# Patient Record
Sex: Female | Born: 1975 | Race: White | Hispanic: Yes | Marital: Married | Smoking: Never smoker
Health system: Southern US, Community
[De-identification: ages and names within clinical notes are randomized; demographics above are authoritative.]

## PROBLEM LIST (undated history)

## (undated) DIAGNOSIS — D219 Benign neoplasm of connective and other soft tissue, unspecified: Secondary | ICD-10-CM

## (undated) DIAGNOSIS — E785 Hyperlipidemia, unspecified: Secondary | ICD-10-CM

## (undated) DIAGNOSIS — Z5189 Encounter for other specified aftercare: Secondary | ICD-10-CM

## (undated) DIAGNOSIS — K219 Gastro-esophageal reflux disease without esophagitis: Secondary | ICD-10-CM

## (undated) HISTORY — PX: DILATION AND CURETTAGE OF UTERUS: SHX78

## (undated) HISTORY — DX: Benign neoplasm of connective and other soft tissue, unspecified: D21.9

## (undated) HISTORY — DX: Encounter for other specified aftercare: Z51.89

---

## 2001-09-26 ENCOUNTER — Ambulatory Visit (HOSPITAL_COMMUNITY): Admission: RE | Admit: 2001-09-26 | Discharge: 2001-09-26 | Payer: Self-pay | Admitting: *Deleted

## 2002-02-19 ENCOUNTER — Inpatient Hospital Stay (HOSPITAL_COMMUNITY): Admission: AD | Admit: 2002-02-19 | Discharge: 2002-02-20 | Payer: Self-pay | Admitting: *Deleted

## 2004-11-12 ENCOUNTER — Encounter: Admission: RE | Admit: 2004-11-12 | Discharge: 2004-11-12 | Payer: Self-pay | Admitting: Internal Medicine

## 2004-12-25 ENCOUNTER — Emergency Department (HOSPITAL_COMMUNITY): Admission: EM | Admit: 2004-12-25 | Discharge: 2004-12-26 | Payer: Self-pay | Admitting: Emergency Medicine

## 2005-02-07 ENCOUNTER — Encounter: Payer: Self-pay | Admitting: Emergency Medicine

## 2005-02-08 ENCOUNTER — Observation Stay (HOSPITAL_COMMUNITY): Admission: AD | Admit: 2005-02-08 | Discharge: 2005-02-08 | Payer: Self-pay | Admitting: Obstetrics and Gynecology

## 2005-05-14 ENCOUNTER — Inpatient Hospital Stay (HOSPITAL_COMMUNITY): Admission: AD | Admit: 2005-05-14 | Discharge: 2005-05-14 | Payer: Self-pay | Admitting: Obstetrics and Gynecology

## 2005-05-15 ENCOUNTER — Inpatient Hospital Stay (HOSPITAL_COMMUNITY): Admission: AD | Admit: 2005-05-15 | Discharge: 2005-05-15 | Payer: Self-pay | Admitting: Obstetrics and Gynecology

## 2005-06-02 ENCOUNTER — Ambulatory Visit (HOSPITAL_COMMUNITY): Admission: RE | Admit: 2005-06-02 | Discharge: 2005-06-02 | Payer: Self-pay | Admitting: *Deleted

## 2005-08-16 ENCOUNTER — Encounter (INDEPENDENT_AMBULATORY_CARE_PROVIDER_SITE_OTHER): Payer: Self-pay | Admitting: *Deleted

## 2005-08-16 ENCOUNTER — Inpatient Hospital Stay (HOSPITAL_COMMUNITY): Admission: AD | Admit: 2005-08-16 | Discharge: 2005-08-18 | Payer: Self-pay | Admitting: *Deleted

## 2009-05-09 ENCOUNTER — Inpatient Hospital Stay (HOSPITAL_COMMUNITY): Admission: AD | Admit: 2009-05-09 | Discharge: 2009-05-09 | Payer: Self-pay | Admitting: Family Medicine

## 2009-05-10 ENCOUNTER — Ambulatory Visit: Payer: Self-pay | Admitting: Obstetrics and Gynecology

## 2009-05-10 ENCOUNTER — Encounter: Payer: Self-pay | Admitting: Obstetrics & Gynecology

## 2009-05-10 ENCOUNTER — Observation Stay (HOSPITAL_COMMUNITY): Admission: AD | Admit: 2009-05-10 | Discharge: 2009-05-11 | Payer: Self-pay | Admitting: Obstetrics & Gynecology

## 2009-05-24 ENCOUNTER — Ambulatory Visit: Payer: Self-pay | Admitting: Obstetrics & Gynecology

## 2009-05-24 LAB — CONVERTED CEMR LAB
HCT: 36.4 % (ref 36.0–46.0)
Hemoglobin: 11.6 g/dL — ABNORMAL LOW (ref 12.0–15.0)
MCHC: 31.9 g/dL (ref 30.0–36.0)
MCV: 92.4 fL (ref 78.0–100.0)
Platelets: 391 10*3/uL (ref 150–400)
RBC: 3.94 M/uL (ref 3.87–5.11)
RDW: 14.8 % (ref 11.5–15.5)
WBC: 9.7 10*3/uL (ref 4.0–10.5)

## 2010-02-09 NOTE — L&D Delivery Note (Signed)
Delivery Note At 7:51 PM a healthy female was delivered via Vaginal, Spontaneous Delivery (Presentation: ; Occiput Anterior).  APGAR: 9, 9; weight 7 lb 10 oz (3459 g).   Placenta status: Intact, Spontaneous.  Cord: 3 vessels with the following complications: None.   Anesthesia: Local  Episiotomy: None Lacerations: 1st degree Suture Repair: 3.0 Est. Blood Loss (mL): 300 cc  Mom to postpartum.  Baby to nursery-stable.  Tamirah George,MARIE-LYNE 09/29/2010, 8:09 PM

## 2010-04-01 ENCOUNTER — Other Ambulatory Visit (HOSPITAL_COMMUNITY): Payer: Self-pay | Admitting: Obstetrics & Gynecology

## 2010-04-01 DIAGNOSIS — R772 Abnormality of alphafetoprotein: Secondary | ICD-10-CM

## 2010-04-01 DIAGNOSIS — Z0489 Encounter for examination and observation for other specified reasons: Secondary | ICD-10-CM

## 2010-04-29 LAB — POCT URINALYSIS DIP (DEVICE)
Bilirubin Urine: NEGATIVE
Glucose, UA: NEGATIVE mg/dL
Ketones, ur: NEGATIVE mg/dL
Nitrite: NEGATIVE
Protein, ur: NEGATIVE mg/dL
Specific Gravity, Urine: <=1.005 (ref 1.005–1.030)
Urobilinogen, UA: 0.2 mg/dL (ref 0.0–1.0)
pH: 5.5 (ref 5.0–8.0)

## 2010-04-30 LAB — CBC
HCT: 30.9 % — ABNORMAL LOW (ref 36.0–46.0)
HCT: 34.6 % — ABNORMAL LOW (ref 36.0–46.0)
Hemoglobin: 10.4 g/dL — ABNORMAL LOW (ref 12.0–15.0)
Hemoglobin: 11.9 g/dL — ABNORMAL LOW (ref 12.0–15.0)
MCHC: 33.5 g/dL (ref 30.0–36.0)
MCHC: 34.5 g/dL (ref 30.0–36.0)
MCV: 88.6 fL (ref 78.0–100.0)
MCV: 89.6 fL (ref 78.0–100.0)
Platelets: 187 10*3/uL (ref 150–400)
Platelets: 281 10*3/uL (ref 150–400)
RBC: 3.45 MIL/uL — ABNORMAL LOW (ref 3.87–5.11)
RBC: 3.91 MIL/uL (ref 3.87–5.11)
RDW: 13 % (ref 11.5–15.5)
RDW: 13.3 % (ref 11.5–15.5)
WBC: 11.3 10*3/uL — ABNORMAL HIGH (ref 4.0–10.5)
WBC: 17 10*3/uL — ABNORMAL HIGH (ref 4.0–10.5)

## 2010-04-30 LAB — CROSSMATCH
ABO/RH(D): O POS
Antibody Screen: NEGATIVE

## 2010-04-30 LAB — HEMOGLOBIN AND HEMATOCRIT, BLOOD
HCT: 29.1 % — ABNORMAL LOW (ref 36.0–46.0)
Hemoglobin: 10.2 g/dL — ABNORMAL LOW (ref 12.0–15.0)

## 2010-04-30 LAB — MRSA PCR SCREENING: MRSA by PCR: NEGATIVE

## 2010-05-04 LAB — GC/CHLAMYDIA PROBE AMP, GENITAL
Chlamydia, DNA Probe: NEGATIVE
GC Probe Amp, Genital: NEGATIVE

## 2010-05-04 LAB — WET PREP, GENITAL
Clue Cells Wet Prep HPF POC: NONE SEEN
Trich, Wet Prep: NONE SEEN
Yeast Wet Prep HPF POC: NONE SEEN

## 2010-05-04 LAB — ABO/RH: ABO/RH(D): O POS

## 2010-05-04 LAB — HEMOGLOBIN AND HEMATOCRIT, BLOOD
HCT: 41.6 % (ref 36.0–46.0)
Hemoglobin: 14.4 g/dL (ref 12.0–15.0)

## 2010-05-06 ENCOUNTER — Ambulatory Visit (HOSPITAL_COMMUNITY)
Admission: RE | Admit: 2010-05-06 | Discharge: 2010-05-06 | Disposition: A | Discharge status: Home / Self Care | Payer: Self-pay | Source: Ambulatory Visit | Attending: Obstetrics & Gynecology | Admitting: Obstetrics & Gynecology

## 2010-05-06 DIAGNOSIS — O358XX0 Maternal care for other (suspected) fetal abnormality and damage, not applicable or unspecified: Secondary | ICD-10-CM | POA: Insufficient documentation

## 2010-05-06 DIAGNOSIS — Z0489 Encounter for examination and observation for other specified reasons: Secondary | ICD-10-CM

## 2010-05-06 DIAGNOSIS — R772 Abnormality of alphafetoprotein: Secondary | ICD-10-CM

## 2010-06-27 NOTE — Consult Note (Signed)
Chelsea Yates, Chelsea Yates                  ACCOUNT NO.:  [id number]   MEDICAL RECORD NO.:  [id number]          PATIENT TYPE:  MAT   LOCATION:  [facility]                          FACILITY:  [facility]   PHYSICIAN:  Lenoard Aden, M.D.DATE OF BIRTH:  12/18/1975   DATE OF CONSULTATION:  DATE OF DISCHARGE:                                   CONSULTATION   CHIEF COMPLAINT:  Rule out fetal arrhythmia.   HISTORY OF PRESENT ILLNESS:  She is a 35 year old Hispanic female, G1, P0,  who is at [redacted] weeks gestation with a questionable new onset fetal arrhythmia.  She has had a normal ultrasound today and previous normal anatomical survey  and presents for a followup.   PHYSICAL EXAMINATION:  GENERAL:  She is a well-developed, well-nourished,  Hispanic female in no acute distress.  HEENT:  Normal.  LUNGS:  Clear.  HEART:  Regular rate and rhythm.  ABDOMEN:  Soft, gravid, nontender, fetal heart tones in the 140 to 150 beats  per minute range with irregular heartbeat noted, no evidence of tachy-A or  bradyarrhythmia.  PELVIC:  Exam deferred.  EXTREMITIES:  No cords.  NEUROLOGIC:  Nonfocal.   IMPRESSION:  New onset fetal arrhythmia.   PLAN:  Schedule fetal electrode __________ x1 to 2 hours stay and DC home  with followup scheduled.      Lenoard Aden, M.D.  Electronically Signed     RJT/MEDQ  D:  05/15/2005  T:  05/15/2005  Job:  [id number]

## 2010-09-12 LAB — STREP B DNA PROBE: GBS: NEGATIVE

## 2010-09-29 ENCOUNTER — Inpatient Hospital Stay (HOSPITAL_COMMUNITY)
Admission: AD | Admit: 2010-09-29 | Discharge: 2010-10-01 | DRG: 775 | Disposition: A | Discharge status: Home / Self Care | Payer: Medicaid Other | Source: Ambulatory Visit | Attending: Obstetrics & Gynecology | Admitting: Obstetrics & Gynecology

## 2010-09-29 ENCOUNTER — Encounter (HOSPITAL_COMMUNITY): Payer: Self-pay | Admitting: *Deleted

## 2010-09-29 DIAGNOSIS — O09519 Supervision of elderly primigravida, unspecified trimester: Secondary | ICD-10-CM | POA: Diagnosis present

## 2010-09-29 LAB — CBC
Hemoglobin: 13.5 g/dL (ref 12.0–15.0)
RBC: 4.46 MIL/uL (ref 3.87–5.11)
WBC: 11.4 10*3/uL — ABNORMAL HIGH (ref 4.0–10.5)

## 2010-09-29 MED ORDER — CITRIC ACID-SODIUM CITRATE 334-500 MG/5ML PO SOLN: 30.0000 mL | ORAL | Status: DC | PRN

## 2010-09-29 MED ORDER — IBUPROFEN 600 MG PO TABS
600.0000 mg | ORAL_TABLET | Freq: Four times a day (QID) | ORAL | Status: DC | PRN
  Administered 2010-09-29: 600 mg via ORAL
  Filled 2010-09-29: qty 1

## 2010-09-29 MED ORDER — WITCH HAZEL-GLYCERIN EX PADS: 1.0000 "application " | MEDICATED_PAD | CUTANEOUS | Status: DC | PRN

## 2010-09-29 MED ORDER — ZOLPIDEM TARTRATE 5 MG PO TABS: 5.0000 mg | ORAL_TABLET | Freq: Every evening | ORAL | Status: DC | PRN

## 2010-09-29 MED ORDER — PRENATAL PLUS 27-1 MG PO TABS
1.0000 | ORAL_TABLET | Freq: Every day | ORAL | Status: DC
  Administered 2010-09-30 – 2010-10-01 (×2): 1 via ORAL
  Filled 2010-09-29 (×2): qty 1

## 2010-09-29 MED ORDER — OXYCODONE-ACETAMINOPHEN 5-325 MG PO TABS
2.0000 | ORAL_TABLET | ORAL | Status: DC | PRN
  Administered 2010-09-29: 2 via ORAL
  Filled 2010-09-29: qty 2

## 2010-09-29 MED ORDER — ONDANSETRON HCL 4 MG PO TABS: 4.0000 mg | ORAL_TABLET | ORAL | Status: DC | PRN

## 2010-09-29 MED ORDER — OXYCODONE-ACETAMINOPHEN 5-325 MG PO TABS: 1.0000 | ORAL_TABLET | ORAL | Status: DC | PRN

## 2010-09-29 MED ORDER — OXYTOCIN 20 UNITS IN LACTATED RINGERS INFUSION - SIMPLE
125.0000 mL/h | INTRAVENOUS | Status: DC
  Administered 2010-09-29: 125 mL/h via INTRAVENOUS

## 2010-09-29 MED ORDER — SIMETHICONE 80 MG PO CHEW: 80.0000 mg | CHEWABLE_TABLET | ORAL | Status: DC | PRN

## 2010-09-29 MED ORDER — TETANUS-DIPHTH-ACELL PERTUSSIS 5-2.5-18.5 LF-MCG/0.5 IM SUSP
0.5000 mL | Freq: Once | INTRAMUSCULAR | Status: AC
  Administered 2010-09-30: 0.5 mL via INTRAMUSCULAR
  Filled 2010-09-29: qty 0.5

## 2010-09-29 MED ORDER — FLEET ENEMA 7-19 GM/118ML RE ENEM: 1.0000 | ENEMA | RECTAL | Status: DC | PRN

## 2010-09-29 MED ORDER — BENZOCAINE-MENTHOL 20-0.5 % EX AERO: 1.0000 "application " | INHALATION_SPRAY | CUTANEOUS | Status: DC | PRN

## 2010-09-29 MED ORDER — ONDANSETRON HCL 4 MG/2ML IJ SOLN: 4.0000 mg | INTRAMUSCULAR | Status: DC | PRN

## 2010-09-29 MED ORDER — LACTATED RINGERS IV SOLN: 500.0000 mL | INTRAVENOUS | Status: DC | PRN

## 2010-09-29 MED ORDER — ACETAMINOPHEN 325 MG PO TABS: 650.0000 mg | ORAL_TABLET | ORAL | Status: DC | PRN

## 2010-09-29 MED ORDER — SENNOSIDES-DOCUSATE SODIUM 8.6-50 MG PO TABS
2.0000 | ORAL_TABLET | Freq: Every day | ORAL | Status: DC
  Administered 2010-09-30: 2 via ORAL

## 2010-09-29 MED ORDER — LACTATED RINGERS IV SOLN
INTRAVENOUS | Status: DC
  Administered 2010-09-29: 19:00:00 via INTRAVENOUS

## 2010-09-29 MED ORDER — IBUPROFEN 600 MG PO TABS
600.0000 mg | ORAL_TABLET | Freq: Four times a day (QID) | ORAL | Status: DC
  Administered 2010-09-30 – 2010-10-01 (×6): 600 mg via ORAL
  Filled 2010-09-29 (×6): qty 1

## 2010-09-29 MED ORDER — OXYTOCIN BOLUS FROM INFUSION
500.0000 mL | Freq: Once | INTRAVENOUS | Status: DC
  Filled 2010-09-29: qty 1000
  Filled 2010-09-29: qty 500

## 2010-09-29 MED ORDER — LANOLIN HYDROUS EX OINT: TOPICAL_OINTMENT | CUTANEOUS | Status: DC | PRN

## 2010-09-29 MED ORDER — ONDANSETRON HCL 4 MG/2ML IJ SOLN: 4.0000 mg | Freq: Four times a day (QID) | INTRAMUSCULAR | Status: DC | PRN

## 2010-09-29 MED ORDER — LIDOCAINE HCL (PF) 1 % IJ SOLN
30.0000 mL | INTRAMUSCULAR | Status: DC | PRN
  Filled 2010-09-29 (×2): qty 30

## 2010-09-29 MED ORDER — DIPHENHYDRAMINE HCL 25 MG PO CAPS: 25.0000 mg | ORAL_CAPSULE | Freq: Four times a day (QID) | ORAL | Status: DC | PRN

## 2010-09-29 MED ORDER — DIBUCAINE 1 % RE OINT: 1.0000 "application " | TOPICAL_OINTMENT | RECTAL | Status: DC | PRN

## 2010-09-29 NOTE — H&P (Signed)
Chelsea Yates is a 35 y.o. female G1P0 [redacted]w[redacted]d presenting for spontaneous labor.  HPI/HPP:  Reg UCs increasing x 5 am today.  No AF leak.  No vag. Bleeding.  No PIH Sx                  Normal pregnancy, but Ultrascreen with increased risk, AMA 35 yo.  OB History    Grav Para Term Preterm Abortions TAB SAB Ect Mult Living   1              No past medical history on file. No past surgical history on file. Family History: family history is not on file. Social History:  does not have a smoking history on file. She does not have any smokeless tobacco history on file. Her alcohol and drug histories not on file. Current facility-administered medications:acetaminophen (TYLENOL) tablet 650 mg, 650 mg, Oral, Q4H PRN, Marie-Lyne Harrold Fitchett;  citric acid-sodium citrate (ORACIT) solution 30 mL, 30 mL, Oral, Q2H PRN, Marie-Lyne Ysidra Sopher;  ibuprofen (ADVIL,MOTRIN) tablet 600 mg, 600 mg, Oral, Q6H PRN, Marie-Lyne Shikita Vaillancourt;  lactated ringers infusion 500-1,000 mL, 500-1,000 mL, Intravenous, PRN, Marie-Lyne Kaylany Tesoriero lactated ringers infusion, , Intravenous, Continuous, Marie-Lyne Advait Buice, Last Rate: 125 mL/hr at 09/29/10 1911;  lidocaine (XYLOCAINE) 1 % injection 30 mL, 30 mL, Subcutaneous, PRN, Marie-Lyne Dorell Gatlin;  ondansetron (ZOFRAN) injection 4 mg, 4 mg, Intravenous, Q6H PRN, Marie-Lyne Samatha Anspach;  oxyCODONE-acetaminophen (PERCOCET) 5-325 MG per tablet 2 tablet, 2 tablet, Oral, Q3H PRN, Marie-Lyne Vitoria Conyer oxytocin (PITOCIN) IV BOLUS FROM BAG, 500 mL, Intravenous, Once, Marie-Lyne Jared Cahn;  oxytocin (PITOCIN) IV infusion 20 units in LR 1000 mL, 125 mL/hr, Intravenous, Continuous, Marie-Lyne Gardiner Espana, Last Rate: 125 mL/hr at 09/29/10 1954, 125 mL/hr at 09/29/10 1954;  sodium phosphate (FLEET) 7-19 GM/118ML enema 1 enema, 1 enema, Rectal, PRN, Marie-Lyne Derwood Becraft No Known Allergies  Dilation: 10 Effacement (%): 100 Station: 0 Exam by:: Dr.Janson Lamar Blood pressure 142/85, pulse 74, temperature 98.1 F (36.7 C), temperature source  Oral, resp. rate 20. AROM clear AF FHR 140's, good variability, no deceleration.  UCs q2-3 min.   HPP: There is no problem list on file for this patient.   Prenatal labs: ABO, Rh:  pos Antibody:  neg Rubella:  Immune RPR:   NR HBsAg:  NR  HIV:  NR  Genetic testing: Increased risk Down 1/23 on Ultrascreen, amnio declined [place] anato: wnl Level 2 1 hr GTT: wnl GBS: Negative (08/03 0000)   Assessment/Plan: Term G4P3 spontaneous labor, advanced dilatation.  F-well-being reassuring.  Ready for vaginal delivery.   Javaris Wigington,MARIE-LYNE 09/29/2010, 8:10 PM

## 2010-09-29 NOTE — Progress Notes (Signed)
Pt presents G5P3, very uncomfortable, SVE 7/100/-1 with a bulging membranes. Call to [facility] for admission orders .

## 2010-09-30 ENCOUNTER — Encounter (HOSPITAL_COMMUNITY): Payer: Self-pay | Admitting: Obstetrics and Gynecology

## 2010-09-30 LAB — CBC
HCT: 33.2 % — ABNORMAL LOW (ref 36.0–46.0)
Hemoglobin: 11.6 g/dL — ABNORMAL LOW (ref 12.0–15.0)
MCHC: 34.9 g/dL (ref 30.0–36.0)
MCV: 87.1 fL (ref 78.0–100.0)
RDW: 13.7 % (ref 11.5–15.5)
WBC: 12.6 10*3/uL — ABNORMAL HIGH (ref 4.0–10.5)

## 2010-09-30 LAB — RPR: RPR Ser Ql: NONREACTIVE

## 2010-09-30 NOTE — Progress Notes (Signed)
  PPD # 1  Subjective: Pt reports feeling well/ Pain controlled with motrin Tolerating po/ Voiding without problems/ No n/v Bleeding is moderate Newborn info:  Information for the patient's newborn:  Briyonna, Omara [[id number]]  female  / circ no/ Feeding: breast    Objective:  VS: Blood pressure 127/75, pulse 98, temperature 98.5 F (36.9 C), temperature source Oral, resp. rate 18   Basename 09/30/10 0538 09/29/10 1930  WBC 12.6* 11.4*  HGB 11.6* 13.5  HCT 33.2* 38.9  PLT 184 183    Blood type: --/--/O POS (08/20 1930) Rubella:   Immune   Physical Exam:  General: alert, cooperative and no distress CV: Regular rate and rhythm Resp: clear Abdomen: soft, nontender, normal bowel sounds Uterine Fundus: firm, below umbilicus, nontender Perineum: healing with good reapproximation Lochia: moderate Ext: Homans sign is negative, no sign of DVT and no edema, redness or tenderness in the calves or thighs   A/P: PPD # 1/ G1P1001 Spanish speaking; hospital interpreter, E Royal present during evaluation of patient. Considering BTL.  Will check with billing dept. Doing well Continue routine post partum orders Anticipate discharge home in AM

## 2010-09-30 NOTE — Progress Notes (Signed)
UR chart review completed.  

## 2010-10-01 MED ORDER — IBUPROFEN 600 MG PO TABS: 600.0000 mg | ORAL_TABLET | Freq: Four times a day (QID) | ORAL | Status: AC

## 2010-10-01 NOTE — Discharge Summary (Signed)
Reviewed, agree, contraception options reviewed, TBD again in office.

## 2010-10-01 NOTE — Discharge Summary (Signed)
Obstetric Discharge Summary Reason for Admission: onset of labor Prenatal Procedures: ultrasound Intrapartum Procedures: spontaneous vaginal delivery Postpartum Procedures: none Complications-Operative and Postpartum: 1st degree perineal laceration Hemoglobin  Date Value Range Status  09/30/2010 11.6* 12.0-15.0 (g/dL) Final     HCT  Date Value Range Status  09/30/2010 33.2* 36.0-46.0 (%) Final    Discharge Diagnoses: Term Pregnancy-delivered  Discharge Information: Date: 10/01/2010 Activity: pelvic rest Diet: routine Medications: Ibuprophen Condition: stable Instructions: refer to practice specific booklet and Spanish postpartum care instruction handout Discharge to: home Follow-up Information    Follow up with MODY,VAISHALI R in 6 weeks.   Contact information:   [address] [place] [address] [phone]          Newborn Data: Live born female  Birth Weight: 7 lb 10 oz (3459 g) APGAR: 9, 9  Home with mother.  PAUL,DANIELA 10/01/2010, 10:24 AM

## 2010-10-01 NOTE — Progress Notes (Addendum)
Post Partum Day #2           Information for the patient's newborn:  Jariyah, Hackley [[id number]]  female  / circumcision not desired   Subjective: No HA, SOB, CP, F/C, breast symptoms. Pain minimal. Normal vaginal bleeding, no clots.    Feeding: breast  Objective: BP 125/81  Pulse 80  Temp(Src) 98.2 F (36.8 C) (Oral)  Resp 18  Breastfeeding? Unknown  No intake or output data in the 24 hours ending 10/01/10 1021     Basename 09/30/10 0538 09/29/10 1930  WBC 12.6* 11.4*  HGB 11.6* 13.5  HCT 33.2* 38.9  PLT 184 183    Blood type: --/--/O POS (08/20 1930) Rubella:      Physical Exam:  General: alert, cooperative and no distress Uterine Fundus: firm Lochia: appropriate Perineum: repair intact, edema none DVT Evaluation: Negative Homan's sign. No significant calf/ankle edema.    Assessment/Plan: PPD # 2 / 35 y.o., G1P1001 S/P:spontaneous vaginal  Declined PPTL d/t cost    normal postpartum exam  Continue current postpartum care  D/C home  D/C instructions reviewed w/ patient and spouse via Spanish interpreter (Eda)  LOS: 2 days   Calirose Mccance, CNM, MSN 10/01/2010, 10:21 AM

## 2013-03-16 ENCOUNTER — Other Ambulatory Visit (HOSPITAL_COMMUNITY): Payer: Self-pay | Admitting: Family

## 2013-03-16 DIAGNOSIS — Z3689 Encounter for other specified antenatal screening: Secondary | ICD-10-CM

## 2013-04-13 ENCOUNTER — Ambulatory Visit (HOSPITAL_COMMUNITY)
Admission: RE | Admit: 2013-04-13 | Discharge: 2013-04-13 | Disposition: A | Discharge status: Home / Self Care | Payer: Medicaid Other | Source: Ambulatory Visit | Attending: Nurse Practitioner | Admitting: Nurse Practitioner

## 2013-04-13 ENCOUNTER — Other Ambulatory Visit (HOSPITAL_COMMUNITY): Payer: Self-pay | Admitting: Nurse Practitioner

## 2013-04-13 DIAGNOSIS — IMO0002 Reserved for concepts with insufficient information to code with codable children: Secondary | ICD-10-CM

## 2013-04-14 ENCOUNTER — Encounter (HOSPITAL_COMMUNITY)
Admission: AD | Disposition: A | Discharge status: Home / Self Care | Payer: Self-pay | Source: Ambulatory Visit | Attending: Obstetrics & Gynecology

## 2013-04-14 ENCOUNTER — Ambulatory Visit (HOSPITAL_COMMUNITY): Payer: Medicaid Other | Admitting: Anesthesiology

## 2013-04-14 ENCOUNTER — Ambulatory Visit (HOSPITAL_COMMUNITY)
Admission: AD | Admit: 2013-04-14 | Discharge: 2013-04-14 | Disposition: A | Discharge status: Home / Self Care | Payer: Medicaid Other | Source: Ambulatory Visit | Attending: Obstetrics & Gynecology | Admitting: Obstetrics & Gynecology

## 2013-04-14 ENCOUNTER — Encounter (HOSPITAL_COMMUNITY): Payer: Medicaid Other | Admitting: Anesthesiology

## 2013-04-14 ENCOUNTER — Encounter (HOSPITAL_COMMUNITY): Payer: Self-pay

## 2013-04-14 DIAGNOSIS — O019 Hydatidiform mole, unspecified: Secondary | ICD-10-CM

## 2013-04-14 DIAGNOSIS — O0289 Other abnormal products of conception: Secondary | ICD-10-CM

## 2013-04-14 DIAGNOSIS — O021 Missed abortion: Secondary | ICD-10-CM | POA: Insufficient documentation

## 2013-04-14 HISTORY — DX: Gastro-esophageal reflux disease without esophagitis: K21.9

## 2013-04-14 HISTORY — PX: DILATION AND EVACUATION: SHX1459

## 2013-04-14 LAB — CBC
HCT: 37.2 % (ref 36.0–46.0)
Hemoglobin: 12.9 g/dL (ref 12.0–15.0)
MCH: 29.5 pg (ref 26.0–34.0)
MCHC: 34.7 g/dL (ref 30.0–36.0)
MCV: 84.9 fL (ref 78.0–100.0)
PLATELETS: 304 10*3/uL (ref 150–400)
RBC: 4.38 MIL/uL (ref 3.87–5.11)
RDW: 13.2 % (ref 11.5–15.5)
WBC: 7.6 10*3/uL (ref 4.0–10.5)

## 2013-04-14 LAB — HCG, QUANTITATIVE, PREGNANCY: hCG, Beta Chain, Quant, S: 11915 m[IU]/mL — ABNORMAL HIGH (ref <5)

## 2013-04-14 SURGERY — DILATION AND EVACUATION, UTERUS
Anesthesia: Monitor Anesthesia Care | Site: Vagina

## 2013-04-14 MED ORDER — LACTATED RINGERS IV SOLN
INTRAVENOUS | Status: DC
Start: 2013-04-14 — End: 2013-04-14
  Administered 2013-04-14: 14:00:00 via INTRAVENOUS

## 2013-04-14 MED ORDER — PROPOFOL 10 MG/ML IV EMUL
INTRAVENOUS | Status: AC
  Filled 2013-04-14: qty 20

## 2013-04-14 MED ORDER — FENTANYL CITRATE 0.05 MG/ML IJ SOLN
INTRAMUSCULAR | Status: AC
Start: 2013-04-14 — End: 2013-04-14
  Filled 2013-04-14: qty 2

## 2013-04-14 MED ORDER — MIDAZOLAM HCL 2 MG/2ML IJ SOLN
INTRAMUSCULAR | Status: AC
  Filled 2013-04-14: qty 2

## 2013-04-14 MED ORDER — LIDOCAINE HCL (CARDIAC) 20 MG/ML IV SOLN
INTRAVENOUS | Status: DC | PRN
  Administered 2013-04-14: 80 mg via INTRAVENOUS

## 2013-04-14 MED ORDER — KETOROLAC TROMETHAMINE 30 MG/ML IJ SOLN
INTRAMUSCULAR | Status: AC
  Filled 2013-04-14: qty 1

## 2013-04-14 MED ORDER — KETOROLAC TROMETHAMINE 30 MG/ML IJ SOLN
INTRAMUSCULAR | Status: DC | PRN
  Administered 2013-04-14: 30 mg via INTRAVENOUS

## 2013-04-14 MED ORDER — DEXAMETHASONE SODIUM PHOSPHATE 10 MG/ML IJ SOLN
INTRAMUSCULAR | Status: DC | PRN
  Administered 2013-04-14: 10 mg via INTRAVENOUS

## 2013-04-14 MED ORDER — BUPIVACAINE HCL (PF) 0.5 % IJ SOLN
INTRAMUSCULAR | Status: AC
  Filled 2013-04-14: qty 30

## 2013-04-14 MED ORDER — ONDANSETRON HCL 4 MG/2ML IJ SOLN
INTRAMUSCULAR | Status: DC | PRN
  Administered 2013-04-14: 4 mg via INTRAVENOUS

## 2013-04-14 MED ORDER — PROPOFOL 10 MG/ML IV EMUL
INTRAVENOUS | Status: DC | PRN
  Administered 2013-04-14 (×2): 30 mg via INTRAVENOUS
  Administered 2013-04-14: 20 mg via INTRAVENOUS
  Administered 2013-04-14: 30 mg via INTRAVENOUS
  Administered 2013-04-14: 20 mg via INTRAVENOUS
  Administered 2013-04-14: 30 mg via INTRAVENOUS
  Administered 2013-04-14 (×2): 20 mg via INTRAVENOUS
  Administered 2013-04-14 (×2): 30 mg via INTRAVENOUS

## 2013-04-14 MED ORDER — FENTANYL CITRATE 0.05 MG/ML IJ SOLN: 25.0000 ug | INTRAMUSCULAR | Status: DC | PRN

## 2013-04-14 MED ORDER — MIDAZOLAM HCL 2 MG/2ML IJ SOLN
INTRAMUSCULAR | Status: DC | PRN
  Administered 2013-04-14: 2 mg via INTRAVENOUS

## 2013-04-14 MED ORDER — IBUPROFEN 600 MG PO TABS: 600.0000 mg | ORAL_TABLET | Freq: Four times a day (QID) | ORAL | Status: DC | PRN

## 2013-04-14 MED ORDER — DEXTROSE 5 % IV SOLN
100.0000 mg | Freq: Once | INTRAVENOUS | Status: AC
  Administered 2013-04-14: 100 mg via INTRAVENOUS
  Filled 2013-04-14: qty 100

## 2013-04-14 MED ORDER — LIDOCAINE HCL (CARDIAC) 20 MG/ML IV SOLN
INTRAVENOUS | Status: AC
  Filled 2013-04-14: qty 5

## 2013-04-14 MED ORDER — OXYCODONE-ACETAMINOPHEN 5-325 MG PO TABS: 1.0000 | ORAL_TABLET | Freq: Four times a day (QID) | ORAL | Status: DC | PRN

## 2013-04-14 MED ORDER — BUPIVACAINE HCL (PF) 0.5 % IJ SOLN
INTRAMUSCULAR | Status: DC | PRN
  Administered 2013-04-14: 30 mL

## 2013-04-14 MED ORDER — METOCLOPRAMIDE HCL 5 MG/ML IJ SOLN: 10.0000 mg | Freq: Once | INTRAMUSCULAR | Status: DC | PRN

## 2013-04-14 MED ORDER — ONDANSETRON HCL 4 MG/2ML IJ SOLN
INTRAMUSCULAR | Status: AC
  Filled 2013-04-14: qty 2

## 2013-04-14 MED ORDER — FENTANYL CITRATE 0.05 MG/ML IJ SOLN
INTRAMUSCULAR | Status: DC | PRN
  Administered 2013-04-14 (×2): 50 ug via INTRAVENOUS

## 2013-04-14 MED ORDER — MEPERIDINE HCL 25 MG/ML IJ SOLN: 6.2500 mg | INTRAMUSCULAR | Status: DC | PRN

## 2013-04-14 MED ORDER — PANTOPRAZOLE SODIUM 40 MG PO TBEC
DELAYED_RELEASE_TABLET | ORAL | Status: AC
  Administered 2013-04-14: 40 mg via ORAL
  Filled 2013-04-14: qty 1

## 2013-04-14 MED ORDER — PANTOPRAZOLE SODIUM 40 MG PO TBEC
40.0000 mg | DELAYED_RELEASE_TABLET | Freq: Once | ORAL | Status: AC
  Administered 2013-04-14: 40 mg via ORAL

## 2013-04-14 MED ORDER — KETOROLAC TROMETHAMINE 30 MG/ML IJ SOLN: 15.0000 mg | Freq: Once | INTRAMUSCULAR | Status: DC | PRN

## 2013-04-14 SURGICAL SUPPLY — 19 items
CATH ROBINSON RED A/P 16FR (CATHETERS) IMPLANT
CLOTH BEACON ORANGE TIMEOUT ST (SAFETY) ×3 IMPLANT
DECANTER SPIKE VIAL GLASS SM (MISCELLANEOUS) ×3 IMPLANT
GLOVE BIO SURGEON STRL SZ 6.5 (GLOVE) ×2 IMPLANT
GLOVE BIO SURGEONS STRL SZ 6.5 (GLOVE) ×1
GOWN STRL REUS W/TWL LRG LVL3 (GOWN DISPOSABLE) ×6 IMPLANT
KIT BERKELEY 1ST TRIMESTER 3/8 (MISCELLANEOUS) ×3 IMPLANT
NEEDLE SPNL 18GX3.5 QUINCKE PK (NEEDLE) ×3 IMPLANT
NS IRRIG 1000ML POUR BTL (IV SOLUTION) ×3 IMPLANT
PACK VAGINAL MINOR WOMEN LF (CUSTOM PROCEDURE TRAY) ×3 IMPLANT
PAD OB MATERNITY 4.3X12.25 (PERSONAL CARE ITEMS) ×3 IMPLANT
PAD PREP 24X48 CUFFED NSTRL (MISCELLANEOUS) ×3 IMPLANT
SET BERKELEY SUCTION TUBING (SUCTIONS) ×3 IMPLANT
SYR CONTROL 10ML LL (SYRINGE) ×3 IMPLANT
TOWEL OR 17X24 6PK STRL BLUE (TOWEL DISPOSABLE) ×6 IMPLANT
VACURETTE 10 RIGID CVD (CANNULA) IMPLANT
VACURETTE 7MM CVD STRL WRAP (CANNULA) IMPLANT
VACURETTE 8 RIGID CVD (CANNULA) ×2 IMPLANT
VACURETTE 9 RIGID CVD (CANNULA) ×3 IMPLANT

## 2013-04-14 NOTE — Anesthesia Preprocedure Evaluation (Addendum)
Anesthesia Evaluation  Patient identified by MRN, date of birth, ID band Patient awake    Reviewed: Allergy & Precautions, H&P , NPO status , Patient's Chart, lab work & pertinent test results, reviewed documented beta blocker date and time   History of Anesthesia Complications Negative for: history of anesthetic complications  Airway Mallampati: III TM Distance: >3 FB Neck ROM: full    Dental  (+) Edentulous Upper, Edentulous Lower, Upper Dentures, Lower Dentures   Pulmonary neg pulmonary ROS,  breath sounds clear to auscultation        Cardiovascular negative cardio ROS  Rhythm:regular Rate:Normal     Neuro/Psych negative neurological ROS  negative psych ROS   GI/Hepatic negative GI ROS, Neg liver ROS,   Endo/Other  negative endocrine ROS  Renal/GU negative Renal ROS     Musculoskeletal   Abdominal   Peds  Hematology negative hematology ROS (+)   Anesthesia Other Findings   Reproductive/Obstetrics (+) Pregnancy (partial molar pregnancy, 8 weeks)                          Anesthesia Physical Anesthesia Plan  ASA: II  Anesthesia Plan: MAC   Post-op Pain Management:    Induction:   Airway Management Planned:   Additional Equipment:   Intra-op Plan:   Post-operative Plan:   Informed Consent: I have reviewed the patients History and Physical, chart, labs and discussed the procedure including the risks, benefits and alternatives for the proposed anesthesia with the patient or authorized representative who has indicated his/her understanding and acceptance.     Plan Discussed with: Surgeon and CRNA  Anesthesia Plan Comments:         Anesthesia Quick Evaluation

## 2013-04-14 NOTE — Discharge Instructions (Signed)
Dilatacin y curetaje o curetaje por aspiracin - Cuidados posteriores (Dilation and Curettage or Vacuum Curettage, Care After) Siga estas instrucciones durante las prximas semanas. Estas indicaciones le proporcionan informacin general acerca de cmo deber cuidarse despus del procedimiento. El mdico tambin podr darle instrucciones ms especficas. El tratamiento se ha planificado de acuerdo a las prcticas mdicas actuales, pero a veces se producen problemas. Comunquese con el mdico si tiene algn problema o tiene dudas despus del procedimiento. QU ESPERAR DESPUS DEL PROCEDIMIENTO Despus del procedimiento, es tpico tener clicos. Pueden durar entre 1 y 2 das despus del procedimiento. INSTRUCCIONES PARA EL CUIDADO EN EL HOGAR   No conduzca vehculos por 24 horas.  Espere una semana antes de [employer] actividades intensas.  [place], durante 4 das y Chartered certified accountant. Si tiene fiebre, informe estas temperaturas a su mdico.  Government social research officer de pie durante largos perodos.  Evite levantar, tironear o empujar objetos pesados. No levante nada que pese ms de 10 libras (4,5 kg).  Slo suba escaleras una o dos veces por da.  Tome con frecuencia momentos de descanso.  Puede retomar su dieta habitual.  Beba suficiente lquido para mantener la orina clara o de color amarillo plido.  La funcin intestinal normal se restablecer. Si est constipada podr:   Tomar un laxante suave con autorizacin de su mdico.  Agregar frutas y salvado a su dieta.  Beber ms lquidos.  Tome Dentist de baos de inversin hasta que el mdico la autorice.  No tome baos de inmersin ni practique natacin hasta que el mdico la autorice.  Pdale a alguna persona que permanezca con usted durante las primeras 24 a 48 horas, especialmente si le han administrado anestesia general.  No se haga duchas vaginales ni tenga relaciones sexuales durante las 6 semanas  siguientes a la [place].  Tome slo medicamentos de venta libre o recetados, segn las indicaciones del mdico. No tome aspirina. Puede ocasionar hemorragias.  Concurra a las consultas de control con su mdico segn las indicaciones. SOLICITE ATENCIN MDICA SI:   Siente clicos o el dolor no se Radio broadcast assistant.  Siente dolor abdominal que no parece estar relacionado con el rea en que senta los clicos y Conservation officer, historic buildings anteriormente.  Margette Fast secrecin vaginal con mal olor.  Tiene una erupcin.  Tiene problemas con algn medicamento. SOLICITE ATENCIN MDICA DE INMEDIATO SI:   Tiene una hemorragia ms abundante que un perodo menstrual normal.  Tiene fiebre.  Siente dolor en el pecho.  Le falta el aire.  Se siente mareada o sufre un desmayo.  Se desmaya.  Siente dolor en la zona de los breteles de los hombros.  Tiene una hemorragia vaginal abundante, con o sin cogulos Document Released: 11/05/2004 Document Revised: 11/16/2012 [facility] Patient Information 2014 [place], [place].

## 2013-04-14 NOTE — Transfer of Care (Signed)
Immediate Anesthesia Transfer of Care Note  Patient: Chelsea Yates  Procedure(s) Performed: Procedure(s): DILATATION AND CURETTAGE (N/A)  Patient Location: PACU  Anesthesia Type:MAC  Level of Consciousness: sedated  Airway & Oxygen Therapy: Patient Spontanous Breathing  Post-op Assessment: Report given to PACU RN  Post vital signs: Reviewed and stable  Complications: No apparent anesthesia complications

## 2013-04-14 NOTE — Transfer of Care (Deleted)
Immediate Anesthesia Transfer of Care Note  Patient: Chelsea Yates  Procedure(s) Performed: Procedure(s): DILATATION AND EVACUATION (N/A)  Patient Location: PACU  Anesthesia Type:Spinal  Level of Consciousness: awake, alert , oriented and patient cooperative  Airway & Oxygen Therapy: Patient Spontanous Breathing  Post-op Assessment: Report given to PACU RN  Post vital signs: Reviewed and stable  Complications: No apparent anesthesia complications

## 2013-04-14 NOTE — Preoperative (Signed)
Beta Blockers   Reason not to administer Beta Blockers:Not Applicable 

## 2013-04-14 NOTE — Op Note (Signed)
04/14/2013  2:55 PM  PATIENT:  Chelsea Yates  38 y.o. female  PRE-OPERATIVE DIAGNOSIS:  Non-viable pregnancy  POST-OPERATIVE DIAGNOSIS:  same  PROCEDURE:  Procedure(s): DILATATION AND CURETTAGE (N/A)  SURGEON:  Surgeon(s) and Role:    * Emily Filbert, MD - Primary  PHYSICIAN ASSISTANT:   ASSISTANTS: Glyn Ade, MD   ANESTHESIA:   local and MAC  EBL:     BLOOD ADMINISTERED:none  DRAINS: none   LOCAL MEDICATIONS USED:  MARCAINE     SPECIMEN:  Source of Specimen:  uterine curettings  DISPOSITION OF SPECIMEN:  PATHOLOGY  COUNTS:  YES  TOURNIQUET:  * No tourniquets in log *  DICTATION: .Dragon Dictation  PLAN OF CARE: Discharge to home after PACU  PATIENT DISPOSITION:  PACU - hemodynamically stable.   Delay start of Pharmacological VTE agent (>24hrs) due to surgical blood loss or risk of bleeding: not applicable   The risks, benefits, and alternatives of surgery were explained, understood, and accepted. All questions were answered. Consents were signed. In the operating room MAC anesthesia was applied without complication, and she was placed in the dorsal lithotomy position. Her vagina was prepped and draped in the usual sterile fashion. A Robinson catheter was used to drain her bladder. A bimanual exam revealed a 6 week size , anteverted mobile uterus. Her adnexa were nonenlarged. A speculum was placed and a single-tooth tenaculum was used to grasp the anterior lip of her cervix. A total of 30 mL of 0.5% Marcaine was used to perform a paracervical block. Her uterus sounded to 9 cm. Her cervix was carefully and slowly and easily dilated to accommodate a #9 curved suction curet. A curettage was done in all quadrants and the fundus of the uterus. A large amount of tissue was obtained. I then used a sharp curette to assure complete emptying of the uterus. There was no bleeding noted at the end of the case. She was taken to the recovery room after being extubated. She tolerated the  procedure well.

## 2013-04-14 NOTE — Anesthesia Postprocedure Evaluation (Signed)
  Anesthesia Post-op Note  Anesthesia Post Note  Patient: Chelsea Yates  Procedure(s) Performed: Procedure(s) (LRB): DILATATION AND CURETTAGE (N/A)  Anesthesia type: MAC  Patient location: PACU  Post pain: Pain level controlled  Post assessment: Post-op Vital signs reviewed  Last Vitals:  Filed Vitals:   04/14/13 1615  BP: 99/54  Pulse: 79  Temp:   Resp: 16    Post vital signs: Reviewed  Level of consciousness: sedated  Complications: No apparent anesthesia complications

## 2013-04-14 NOTE — H&P (Signed)
  38 yo MH G6P4A1 now with non viable pregnancy. This was discovered on u/s yesterday.  PMH- none  PSH- d&c for miscarriage  Meds-PNV  NKDA  SH- negative  FH- [place]  VSS, AF WNWHWF NAD  Heart- rrr Lungs- CTAB Abd- benign  A/p. Non-viable pregnancy. She opts for d&c. Risks of surgery explained.

## 2013-04-17 ENCOUNTER — Encounter (HOSPITAL_COMMUNITY): Payer: Self-pay | Admitting: Obstetrics & Gynecology

## 2013-04-26 DIAGNOSIS — O020 Blighted ovum and nonhydatidiform mole: Secondary | ICD-10-CM

## 2013-04-27 ENCOUNTER — Ambulatory Visit (HOSPITAL_COMMUNITY): Payer: Medicaid Other

## 2013-05-24 ENCOUNTER — Ambulatory Visit (INDEPENDENT_AMBULATORY_CARE_PROVIDER_SITE_OTHER): Payer: Medicaid Other | Admitting: Obstetrics & Gynecology

## 2013-05-24 ENCOUNTER — Encounter: Payer: Self-pay | Admitting: Obstetrics & Gynecology

## 2013-05-24 VITALS — BP 130/82 | HR 80 | Temp 97.6°F | Ht <= 58 in | Wt 148.8 lb

## 2013-05-24 DIAGNOSIS — O0289 Other abnormal products of conception: Secondary | ICD-10-CM

## 2013-05-24 DIAGNOSIS — Z09 Encounter for follow-up examination after completed treatment for conditions other than malignant neoplasm: Secondary | ICD-10-CM

## 2013-05-24 DIAGNOSIS — O020 Blighted ovum and nonhydatidiform mole: Secondary | ICD-10-CM

## 2013-05-24 LAB — HCG, QUANTITATIVE, PREGNANCY

## 2013-05-24 MED ORDER — IBUPROFEN 800 MG PO TABS: 800.0000 mg | ORAL_TABLET | Freq: Three times a day (TID) | ORAL | Status: DC | PRN

## 2013-05-24 NOTE — Progress Notes (Signed)
   Subjective:    Patient ID: Chelsea Yates, female    DOB: 1975/06/01, 38 y.o.   MRN: [id number]  HPI  38 yo MH lady who had a d&c for a non-viable pregnancy 6 weeks ago. A molar pregnancy was discovered. Her QBHCG was 11000 on 04-14-13 and will be redrawn today. She has not had sex since the d&c. She had a normal period 05-19-13. She does report some LBP but it has not been bad enough for her to take Tylenol or IBU.  Review of Systems     Objective:   Physical Exam  Cervix appears normal Uterus ULN size, AV, mobile, NT Normal adnexal exam      Assessment & Plan:  Molar pregnancy- follow QBHCG LBP- IBU 800 mg q 8 hr prn U/S and CXR if QBHCG not falling

## 2013-06-07 ENCOUNTER — Other Ambulatory Visit: Payer: Self-pay

## 2013-06-07 DIAGNOSIS — E349 Endocrine disorder, unspecified: Secondary | ICD-10-CM

## 2013-06-08 LAB — HCG, QUANTITATIVE, PREGNANCY

## 2013-06-21 ENCOUNTER — Other Ambulatory Visit: Payer: Self-pay

## 2013-06-21 DIAGNOSIS — O020 Blighted ovum and nonhydatidiform mole: Secondary | ICD-10-CM

## 2013-06-22 LAB — HCG, QUANTITATIVE, PREGNANCY: hCG, Beta Chain, Quant, S: 3.6 m[IU]/mL

## 2013-07-19 ENCOUNTER — Other Ambulatory Visit: Payer: Self-pay

## 2013-07-19 ENCOUNTER — Encounter: Payer: Self-pay | Admitting: General Practice

## 2013-07-19 DIAGNOSIS — O020 Blighted ovum and nonhydatidiform mole: Secondary | ICD-10-CM

## 2013-07-19 NOTE — Progress Notes (Signed)
Patient here in office today for bhcg only and wants to know when to follow up again. Raquel for interpreter. Dr Elly Modena reviewed chart and stated to make sure patient is using condoms each time and that based off what that hcg is determines when she will need to follow up. Patient informed to use condoms each time and that we will call her and let her know when she needs to follow up. Patient verbalized understanding and had no questions

## 2013-07-20 LAB — HCG, QUANTITATIVE, PREGNANCY: hCG, Beta Chain, Quant, S: <2 m[IU]/mL

## 2013-07-21 ENCOUNTER — Telehealth: Payer: Self-pay | Admitting: *Deleted

## 2013-07-21 NOTE — Telephone Encounter (Signed)
Message copied by Mitchell Heir on Fri Jul 21, 2013 11:55 AM ------      Message from: CONSTANT, [place]      Created: Thu Jul 20, 2013 10:44 AM       Please inform patient of need to continue monthly quant HCG until December 2015. Please stress the importance of using contraception during this time as to avoid a new pregnancy. ------

## 2013-07-25 NOTE — Telephone Encounter (Signed)
Attempted to call patient with [facility] interpreter [phone]. Informed patient of results and the need to have a beta quant drawn once every month until December 2015. Explained this is to ensure hormone remains <2. Discussed the importance of not becoming pregnant during this time frame as the likelihood of molar pregnancy again would be high and dangerous. Advised patient that she should absolutley use condoms or other method of contraception during this time so that she does not become pregnant. Patient verbalized understanding. Patient states she can come for repeat beta quant on August 18, 2013 at 1000. Informed patient I would put her on the schedule. No further questions or concerns.

## 2013-08-17 ENCOUNTER — Other Ambulatory Visit: Payer: Self-pay

## 2013-08-17 DIAGNOSIS — O020 Blighted ovum and nonhydatidiform mole: Secondary | ICD-10-CM

## 2013-08-18 ENCOUNTER — Other Ambulatory Visit: Payer: Self-pay

## 2013-08-18 LAB — HCG, QUANTITATIVE, PREGNANCY

## 2013-09-07 ENCOUNTER — Telehealth: Payer: Self-pay | Admitting: *Deleted

## 2013-09-07 NOTE — Telephone Encounter (Signed)
Patient called to request to cancel her appointment. I spoke with her via interpreter Dori. I informed patient that it is very important for her to come for her labs monthly until December as she had a molar pregnancy and it is critical that we monitor her labs closely. Patient agrees and stated that she will come for her appointment.

## 2013-09-18 ENCOUNTER — Other Ambulatory Visit: Payer: Self-pay

## 2013-09-18 ENCOUNTER — Telehealth: Payer: Self-pay | Admitting: *Deleted

## 2013-09-18 DIAGNOSIS — N939 Abnormal uterine and vaginal bleeding, unspecified: Secondary | ICD-10-CM

## 2013-09-18 LAB — HCG, QUANTITATIVE, PREGNANCY: hCG, Beta Chain, Quant, S: <2 m[IU]/mL

## 2013-09-18 NOTE — Telephone Encounter (Signed)
Contacted patient with [employer], results and plan given.  Pt verbalizes understanding.  Message sent to front desk to place on lab schedule on 10/19/2013 at 1:00pm.

## 2013-09-18 NOTE — Telephone Encounter (Signed)
Message copied by Sue Lush on Mon Sep 18, 2013  4:40 PM ------      Message from: Verita Schneiders A      Created: Mon Sep 18, 2013  4:38 PM       Patient needs to return for repeat HCG check next month ~ 10/19/13.  If still negative, no further checks needed (negative for 6 months) ------

## 2013-10-19 ENCOUNTER — Other Ambulatory Visit: Payer: Self-pay

## 2013-10-19 DIAGNOSIS — O020 Blighted ovum and nonhydatidiform mole: Secondary | ICD-10-CM

## 2013-10-20 LAB — HCG, QUANTITATIVE, PREGNANCY: hCG, Beta Chain, Quant, S: <2 m[IU]/mL

## 2013-10-25 ENCOUNTER — Telehealth: Payer: Self-pay | Admitting: *Deleted

## 2013-10-25 NOTE — Telephone Encounter (Signed)
Pt left message w/interpreter requesting results.  She will needs Spanish interpreter when calling back.

## 2013-10-26 NOTE — Telephone Encounter (Signed)
Called patient back with [facility] for interpreter and patient was requesting bhcg results. Per chart review patient was having bhcg levels followed for molar pregnancy and doesn't need further follow up of hcg levels since she is now negative for 6 months. Informed patient of results and that no further follow up is needed at this time. Patient verbalized understanding and is aware that she needs to continue to prevent pregnancy and should not try to get pregnant until next year if she desires. Patient verbalized understanding and had no other questions

## 2013-11-17 ENCOUNTER — Other Ambulatory Visit: Payer: Self-pay

## 2013-12-11 ENCOUNTER — Encounter: Payer: Self-pay | Admitting: Obstetrics & Gynecology

## 2013-12-19 ENCOUNTER — Other Ambulatory Visit: Payer: Self-pay

## 2014-01-18 ENCOUNTER — Other Ambulatory Visit: Payer: Self-pay

## 2016-11-16 ENCOUNTER — Other Ambulatory Visit: Payer: Self-pay | Admitting: Obstetrics and Gynecology

## 2016-11-16 DIAGNOSIS — Z1231 Encounter for screening mammogram for malignant neoplasm of breast: Secondary | ICD-10-CM

## 2016-11-19 ENCOUNTER — Ambulatory Visit
Admission: RE | Admit: 2016-11-19 | Discharge: 2016-11-19 | Disposition: A | Discharge status: Home / Self Care | Payer: No Typology Code available for payment source | Source: Ambulatory Visit | Attending: Obstetrics and Gynecology | Admitting: Obstetrics and Gynecology

## 2016-11-19 ENCOUNTER — Encounter (HOSPITAL_COMMUNITY): Payer: Self-pay

## 2016-11-19 ENCOUNTER — Ambulatory Visit (HOSPITAL_COMMUNITY)
Admission: RE | Admit: 2016-11-19 | Discharge: 2016-11-19 | Disposition: A | Discharge status: Home / Self Care | Payer: Self-pay | Source: Ambulatory Visit | Attending: Obstetrics and Gynecology | Admitting: Obstetrics and Gynecology

## 2016-11-19 VITALS — BP 124/84 | Temp 98.8°F | Ht <= 58 in

## 2016-11-19 DIAGNOSIS — Z01419 Encounter for gynecological examination (general) (routine) without abnormal findings: Secondary | ICD-10-CM

## 2016-11-19 DIAGNOSIS — Z1231 Encounter for screening mammogram for malignant neoplasm of breast: Secondary | ICD-10-CM

## 2016-11-19 NOTE — Patient Instructions (Signed)
Explained breast self awareness with Hulda Humphrey. Let patient know [facility] will cover Pap smears and HPV typing every 5 years unless has a history of abnormal Pap smears. Referred patient to the [facility] for a screening mammogram. Appointment scheduled for Thursday, November 19, 2016 at 1040. Let patient know will follow up with her within the next couple weeks with results of Pap smear by phone. Informed patient that [facility] will follow up with her within the next couple of weeks with results of mammogram by letter or phone. Chelsea Yates verbalized understanding.  Chelsea Yates, Arvil Chaco, RN 12:47 PM

## 2016-11-19 NOTE — Progress Notes (Signed)
No complaints today.   Pap Smear: Pap smear completed today. Last Pap smear was 06/21/2008 and normal. Per patient has no history of an abnormal Pap smear. Last Pap smear result is in EPIC.  Physical exam: Breasts Breasts symmetrical. No skin abnormalities bilateral breasts. No nipple retraction bilateral breasts. No nipple discharge bilateral breasts. No lymphadenopathy. No lumps palpated bilateral breasts. No complaints of pain or tenderness on exam. Referred patient to the [facility] for a screening mammogram. Appointment scheduled for Thursday, November 19, 2016 at 1040.  Pelvic/Bimanual   Ext Genitalia No lesions, no swelling and no discharge observed on external genitalia.         Vagina Vagina pink and normal texture. No lesions or discharge observed in vagina.          Cervix Cervix is present. Cervix pink and of normal texture. No discharge observed.     Uterus Uterus is present and palpable. Uterus in normal position and normal size.        Adnexae Bilateral ovaries present and palpable. No tenderness on palpation.          Rectovaginal No rectal exam completed today since patient had no rectal complaints. No skin abnormalities observed on exam.    Smoking History: Patient has never smoked.  Patient Navigation: Patient education provided. Access to services provided for patient through [facility] program. Spanish interpreter provided.  Used Spanish interpreter [employer] from [place].

## 2016-11-20 ENCOUNTER — Encounter (HOSPITAL_COMMUNITY): Payer: Self-pay | Admitting: *Deleted

## 2016-11-23 ENCOUNTER — Other Ambulatory Visit: Payer: Self-pay | Admitting: Obstetrics and Gynecology

## 2016-11-23 DIAGNOSIS — R928 Other abnormal and inconclusive findings on diagnostic imaging of breast: Secondary | ICD-10-CM

## 2016-11-23 LAB — CYTOLOGY - PAP
Diagnosis: NEGATIVE
HPV: NOT DETECTED

## 2016-11-25 ENCOUNTER — Telehealth (HOSPITAL_COMMUNITY): Payer: Self-pay | Admitting: *Deleted

## 2016-11-25 NOTE — Telephone Encounter (Signed)
Telephoned patient at home number and advised patient of negative pap smear results. HPV was negative. Next pap smear due in five years. Patient voiced understanding. Used interpreter Julie Sowell.  

## 2016-12-01 ENCOUNTER — Other Ambulatory Visit: Payer: Self-pay | Admitting: Obstetrics and Gynecology

## 2016-12-01 ENCOUNTER — Ambulatory Visit
Admission: RE | Admit: 2016-12-01 | Discharge: 2016-12-01 | Disposition: A | Discharge status: Home / Self Care | Payer: No Typology Code available for payment source | Source: Ambulatory Visit | Attending: Obstetrics and Gynecology | Admitting: Obstetrics and Gynecology

## 2016-12-01 DIAGNOSIS — R928 Other abnormal and inconclusive findings on diagnostic imaging of breast: Secondary | ICD-10-CM

## 2016-12-10 ENCOUNTER — Ambulatory Visit (HOSPITAL_COMMUNITY): Payer: No Typology Code available for payment source

## 2017-06-01 ENCOUNTER — Other Ambulatory Visit: Payer: No Typology Code available for payment source

## 2017-06-02 ENCOUNTER — Ambulatory Visit
Admission: RE | Admit: 2017-06-02 | Discharge: 2017-06-02 | Disposition: A | Discharge status: Home / Self Care | Payer: No Typology Code available for payment source | Source: Ambulatory Visit | Attending: Obstetrics and Gynecology | Admitting: Obstetrics and Gynecology

## 2017-06-02 ENCOUNTER — Other Ambulatory Visit: Payer: Self-pay | Admitting: Obstetrics and Gynecology

## 2017-06-02 DIAGNOSIS — Z1231 Encounter for screening mammogram for malignant neoplasm of breast: Secondary | ICD-10-CM

## 2017-06-02 DIAGNOSIS — N6310 Unspecified lump in the right breast, unspecified quadrant: Secondary | ICD-10-CM

## 2017-06-02 DIAGNOSIS — R928 Other abnormal and inconclusive findings on diagnostic imaging of breast: Secondary | ICD-10-CM

## 2017-06-02 DIAGNOSIS — N63 Unspecified lump in unspecified breast: Secondary | ICD-10-CM

## 2017-10-20 ENCOUNTER — Other Ambulatory Visit (HOSPITAL_COMMUNITY): Payer: Self-pay | Admitting: *Deleted

## 2017-10-20 DIAGNOSIS — N6310 Unspecified lump in the right breast, unspecified quadrant: Secondary | ICD-10-CM

## 2017-12-06 ENCOUNTER — Other Ambulatory Visit: Payer: Self-pay

## 2017-12-21 ENCOUNTER — Ambulatory Visit
Admission: RE | Admit: 2017-12-21 | Discharge: 2017-12-21 | Disposition: A | Discharge status: Home / Self Care | Payer: No Typology Code available for payment source | Source: Ambulatory Visit | Attending: Obstetrics and Gynecology | Admitting: Obstetrics and Gynecology

## 2017-12-21 ENCOUNTER — Encounter (HOSPITAL_COMMUNITY): Payer: Self-pay

## 2017-12-21 ENCOUNTER — Ambulatory Visit (HOSPITAL_COMMUNITY)
Admission: RE | Admit: 2017-12-21 | Discharge: 2017-12-21 | Disposition: A | Discharge status: Home / Self Care | Payer: Self-pay | Source: Ambulatory Visit | Attending: Obstetrics and Gynecology | Admitting: Obstetrics and Gynecology

## 2017-12-21 VITALS — BP 122/76 | Wt 145.0 lb

## 2017-12-21 DIAGNOSIS — Z1239 Encounter for other screening for malignant neoplasm of breast: Secondary | ICD-10-CM

## 2017-12-21 DIAGNOSIS — N6310 Unspecified lump in the right breast, unspecified quadrant: Secondary | ICD-10-CM

## 2017-12-21 HISTORY — DX: Hyperlipidemia, unspecified: E78.5

## 2017-12-21 NOTE — Progress Notes (Signed)
Patient referred to [facility] by the [place] due to recommending 78-month right breast diagnostic mammogram and ultrasound. Last right breast diagnostic mammogram and ultrasound was completed 06/02/2017. Last bilateral screening mammogram was completed 12/10/2016.  Pap Smear: Pap smear not completed today. Last Pap smear was 11/19/2016 at [facility] and normal with negative HPV. Per patient has no history of an abnormal Pap smear. Last two Pap smear results are in Epic.  Physical exam: Breasts Left breast slightly larger than right breast. Per patient no changes observed. No skin abnormalities bilateral breasts. No nipple retraction bilateral breasts. No nipple discharge bilateral breasts. No lymphadenopathy. No lumps palpated bilateral breasts. No complaints of pain or tenderness on exam. Referred patient to the [facility] for a diagnostic mammogram and right breast ultrasound per recommendation. Appointment scheduled for Tuesday, December 21, 2017 at 1450.        Pelvic/Bimanual No Pap smear completed today since last Pap smear and HPV typing was 11/19/2016. Pap smear not indicated per BCCCP guidelines.   Smoking History: Patient has never smoked.  Patient Navigation: Patient education provided. Access to services provided for patient through [facility] program. Spanish interpreter provided.   Breast and Cervical Cancer Risk Assessment: Patient has no family history of breast cancer, known genetic mutations, or radiation treatment to the chest before age 72. Patient has no history of cervical dysplasia, immunocompromised, or DES exposure in-utero.  Risk Assessment    Risk Scores      12/21/2017   Last edited by: Armond Hang, LPN   5-year risk: 0.4 %   Lifetime risk: 5.7 %        . Used Spanish interpreter [employer] from [place].

## 2017-12-21 NOTE — Patient Instructions (Addendum)
Explained breast self awareness with [name]. Patient did not need a Pap smear today due to last Pap smear and HPV typing was 11/19/2016. Let her know BCCCP will cover Pap smears and HPV typing every 5 years unless has a history of abnormal Pap smears. Referred patient to the [facility] for a diagnostic mammogram and right breast ultrasound per recommendation. Appointment scheduled for Tuesday, December 21, 2017 at 1450. Patient aware of appointment and will be there. Chelsea Yates verbalized understanding.  Italo Banton, Arvil Chaco, RN 3:45 PM

## 2017-12-31 ENCOUNTER — Encounter (HOSPITAL_COMMUNITY): Payer: Self-pay | Admitting: *Deleted

## 2018-04-04 ENCOUNTER — Other Ambulatory Visit: Payer: Self-pay | Admitting: Family Medicine

## 2018-04-04 DIAGNOSIS — Z124 Encounter for screening for malignant neoplasm of cervix: Secondary | ICD-10-CM

## 2018-04-04 NOTE — Progress Notes (Signed)
Patient: Chelsea Yates           Date of Birth: November 22, 1975           MRN: [id number] Visit Date: 04/04/2018 PCP: Patient, No Pcp Per  Cervical Cancer Screening Do you smoke?: No Have you ever had or been told you have an allergy to latex products?: No Marital status: Single Date of last pap smear: Don't know Number of pregnancies: 5  Cervical Exam  Abnormal Observations: Nothing abnormal Recommendations: Repeat  in 3 years if normal.       Patient's History Patient Active Problem List   Diagnosis Date Noted  . Postpartum care following vaginal delivery 09/30/2010   Past Medical History:  Diagnosis Date  . GERD (gastroesophageal reflux disease)    no meds  . Hyperlipidemia   . Postpartum care following vaginal delivery 09/30/2010    Family History  Problem Relation Age of Onset  . Hypertension Mother   . Cancer Maternal Grandmother        uterine    Past Surgical History:  Procedure Laterality Date  . DILATION AND CURETTAGE OF UTERUS    . DILATION AND EVACUATION N/A 04/14/2013   Procedure: DILATATION AND CURETTAGE;  Surgeon: Emily Filbert, MD;  Location: [place] [facility];  Service: Gynecology;  Laterality: N/A;   Social History   Occupational History  . Not on file  Tobacco Use  . Smoking status: Never Smoker  . Smokeless tobacco: Never Used  Substance and Sexual Activity  . Alcohol use: No  . Drug use: No  . Sexual activity: Yes

## 2018-04-04 NOTE — Addendum Note (Signed)
Addended by: Armond Hang on: 04/04/2018 08:30 PM   Modules accepted: Orders

## 2018-04-07 LAB — CYTOLOGY - PAP: Diagnosis: NEGATIVE

## 2018-05-09 ENCOUNTER — Telehealth (HOSPITAL_COMMUNITY): Payer: Self-pay | Admitting: *Deleted

## 2018-05-09 NOTE — Telephone Encounter (Signed)
Normal Pap smear result letter mailed to patient by Cytology. 

## 2018-12-13 ENCOUNTER — Other Ambulatory Visit (HOSPITAL_COMMUNITY): Payer: Self-pay | Admitting: *Deleted

## 2018-12-13 DIAGNOSIS — D241 Benign neoplasm of right breast: Secondary | ICD-10-CM

## 2019-01-03 ENCOUNTER — Ambulatory Visit (HOSPITAL_COMMUNITY)
Admission: RE | Admit: 2019-01-03 | Discharge: 2019-01-03 | Disposition: A | Discharge status: Home / Self Care | Payer: Self-pay | Source: Ambulatory Visit | Attending: Obstetrics and Gynecology | Admitting: Obstetrics and Gynecology

## 2019-01-03 ENCOUNTER — Other Ambulatory Visit: Payer: Self-pay

## 2019-01-03 ENCOUNTER — Encounter (HOSPITAL_COMMUNITY): Payer: Self-pay

## 2019-01-03 DIAGNOSIS — Z1239 Encounter for other screening for malignant neoplasm of breast: Secondary | ICD-10-CM | POA: Insufficient documentation

## 2019-01-03 NOTE — Progress Notes (Signed)
Patient referred to [facility] by the [place] due to recommending one year bilateral breast diagnostic mammogram and right breast ultrasound. Last diagnostic mammogram and right breast ultrasound was completed 12/21/2017.   Pap Smear: Pap smear not completed today. Last Pap smear was 04/04/2018 at the free cervical cancer screening at the [facility] and normal. Per patient has no history of an abnormal Pap smear. Last two Pap smear results are in Epic.  Physical exam: Breasts Left breast slightly larger than right breast. Per patient no changes observed. No skin abnormalities bilateral breasts. No nipple retraction bilateral breasts. No nipple discharge bilateral breasts. No lymphadenopathy. No lumps palpated bilateral breasts. No complaints of pain or tenderness on exam. Referred patient to the [facility] for a diagnostic mammogram and right breast ultrasound per recommendation. Appointment scheduled for Wednesday, January 04, 2019 at 0910.    Pelvic/Bimanual No Pap smear completed today since last Pap smear was 04/04/2018. Pap smear not indicated per BCCCP guidelines.   Smoking History: Patient has never smoked.  Patient Navigation: Patient education provided. Access to services provided for patient through [facility] program. Spanish interpreter provided.   Breast and Cervical Cancer Risk Assessment: Patient has no family history of breast cancer, known genetic mutations, or radiation treatment to the chest before age 28. Patient has no history of cervical dysplasia, immunocompromised, or DES exposure in-utero.  Risk Assessment    Risk Scores      01/03/2019 12/21/2017   Last edited by: Loletta Parish, RN Armond Hang, LPN   5-year risk: 0.4 % 0.4 %   Lifetime risk: 5.7 % 5.7 %         Used Spanish interpreter Rudene Anda from [place].

## 2019-01-03 NOTE — Patient Instructions (Signed)
Explained breast self awareness with [name]. Patient did not need a Pap smear today due to last Pap smear was 04/04/2018. Let her know BCCCP will cover Pap smears every 3 years unless has a history of abnormal Pap smears. Referred patient to the [facility] for a diagnostic mammogram and right breast ultrasound per recommendation. Appointment scheduled for Wednesday, January 04, 2019 at 0910. Patient aware of appointment and will be there. Chelsea Yates verbalized understanding.  Ezekiah Massie, Arvil Chaco, RN 3:05 PM

## 2019-01-04 ENCOUNTER — Other Ambulatory Visit: Payer: Self-pay

## 2019-01-04 ENCOUNTER — Ambulatory Visit
Admission: RE | Admit: 2019-01-04 | Discharge: 2019-01-04 | Disposition: A | Discharge status: Home / Self Care | Payer: No Typology Code available for payment source | Source: Ambulatory Visit | Attending: Obstetrics and Gynecology | Admitting: Obstetrics and Gynecology

## 2019-01-04 DIAGNOSIS — D241 Benign neoplasm of right breast: Secondary | ICD-10-CM

## 2019-02-08 ENCOUNTER — Ambulatory Visit: Payer: Self-pay

## 2019-02-08 ENCOUNTER — Inpatient Hospital Stay: Payer: Self-pay | Attending: Obstetrics and Gynecology | Admitting: *Deleted

## 2019-02-08 ENCOUNTER — Other Ambulatory Visit: Payer: Self-pay

## 2019-02-08 ENCOUNTER — Other Ambulatory Visit: Payer: Self-pay | Admitting: Obstetrics and Gynecology

## 2019-02-08 VITALS — BP 129/67 | Temp 97.7°F | Ht 60.5 in | Wt 143.0 lb

## 2019-02-08 DIAGNOSIS — Z0000 Encounter for general adult medical examination without abnormal findings: Secondary | ICD-10-CM

## 2019-02-08 NOTE — Progress Notes (Signed)
[facility] initial screening   interpreter- Rudene Anda, [employer]   Clinical Measurement:  Height: 60.5 in Weight: 143 lb  Blood Pressure: 115/62  Blood Pressure #2: 129/67 Fasting Labs Drawn Today, will review with patient when they result.   Medical History:  Patient states that she has a history of high cholesterol. Patient states that she does not have a history of high blood pressure and diabetes.  Medications:  Patient states that she does not take medication to lower cholesterol, blood pressure or blood sugar. Patient does not take an aspirin a day to help prevent a heart attack or stroke.   Blood pressure, self measurement: Patient states that she does not measure blood pressure from home and has not been told to do so by a healthcare provider.   Nutrition: Patient states that on average she eats 2 cups of fruit and 2 cups of vegetables per day. Patient states that she does not eat fish at least 2 times per week. Patient eats less than half servings of whole grains. Patient drinks less than 36 ounces of beverages with added sugar weekly. Patient is currently watching sodium or salt intake. In the past 7 days patient has not had any drinks containing alcohol. On average patient does not drink any drinks containing alcohol.      Physical activity:  Patient states that she gets 80 minutes of moderate and 0 minutes of vigorous physical activity each week.  Smoking status: Patient states that she has never smoked tobacco.   Quality of life:  Over the past 2 weeks patient states that she has had several days where she has little interest or pleasure in doing things and 0 days where she has felt down, depressed or hopeless.    Risk reduction and counseling:    Health Coaching: Encouraged patient to try an extra serving of vegetables each day to get the recommended 3 cups a day. We also discussed adding more heart healthy fish to weekly diet. Explained that the recommendation is for 2 servings  of heart healthy fish each week. Gave suggestions to try salmon, tuna, mackerel or sardines since they are highest in omega-3's. Also encouraged patient to try and add more whole grains into diet like: oatmeal, whole grain cereals, whole grain/whole wheat bread, whole grain pasta and brown rice. Patient currently walks for 20 minutes 4 days a week. Encouraged patient to try and walk on the other days for 20 minutes as well.   Navigation:  I will notify patient of lab results. Patient is aware of 2 more health coaching sessions and a follow up.  Time: 20 minutes

## 2019-02-09 LAB — HGB A1C W/O EAG: Hgb A1c MFr Bld: 5.1 % (ref 4.8–5.6)

## 2019-02-09 LAB — LIPID PANEL W/O CHOL/HDL RATIO
Cholesterol, Total: 248 mg/dL — ABNORMAL HIGH (ref 100–199)
HDL: 52 mg/dL (ref >39)
LDL Chol Calc (NIH): 159 mg/dL — ABNORMAL HIGH (ref 0–99)
Triglycerides: 201 mg/dL — ABNORMAL HIGH (ref 0–149)
VLDL Cholesterol Cal: 37 mg/dL (ref 5–40)

## 2019-02-09 LAB — GLUCOSE, RANDOM: Glucose: 90 mg/dL (ref 65–99)

## 2019-02-13 ENCOUNTER — Telehealth: Payer: Self-pay

## 2019-02-13 NOTE — Telephone Encounter (Signed)
Health coaching 2   interpreter- Rudene Anda, [place]- 248 cholesterol , 159 LDL cholesterol , 201 triglycerides , 52 HDL cholesterol , 5.1 hemoglobin A1C , 90 mean plasma glucose  Patient understands and is aware of her lab results.   Goals-  Discussed lab results with patient and answered any questions that patient had regarding results. Goals- Reduce the amount of fried and fatty foods in diet. Add in more heart healthy fish (salmon, tuna, mackerel or sardines) and whole grains (whole grain bread, whole wheat pasta, brown rice, oatmeal and whole grain cereals) into diet. Add in healthy fats like olive oil and avocados into diet.   Navigation:  Patient is aware of 1 more health coaching sessions and a follow up. Patient is scheduled for follow-up with Internal Medicine on Wednesday, January 13th @ 1:15 pm for elevated cholesterol panel.  Time- 12 minutes

## 2019-02-22 ENCOUNTER — Encounter: Payer: Self-pay | Admitting: Internal Medicine

## 2019-02-22 ENCOUNTER — Other Ambulatory Visit: Payer: Self-pay

## 2019-02-22 ENCOUNTER — Ambulatory Visit (INDEPENDENT_AMBULATORY_CARE_PROVIDER_SITE_OTHER): Payer: Self-pay | Admitting: Internal Medicine

## 2019-02-22 DIAGNOSIS — E785 Hyperlipidemia, unspecified: Secondary | ICD-10-CM | POA: Insufficient documentation

## 2019-02-22 DIAGNOSIS — R1011 Right upper quadrant pain: Secondary | ICD-10-CM | POA: Insufficient documentation

## 2019-02-22 NOTE — Assessment & Plan Note (Signed)
Patient with one year history of intermittent right upper quadrant abdominal pain. She has noticed that the pain occurs every time she tries to eat something. It typically lasts anywhere from 30 minutes to an hour before spontaneously resolving. She has not found anything that will help it sides from not eating. She has had some nausea but no vomiting. She has had no changes in her bowel movements. She has had no fevers or chills. She is never had any intra-abdominal surgeries.  On physical exam she is tenderness to palpation of the right upper quadrant but no rebound or guarding. Bedside ultrasound attempted however the gallbladder could not be visualized.  A/P: - Concerning for biliary colic  - Obtain RUQ ultrasound

## 2019-02-22 NOTE — Patient Instructions (Addendum)
Thank you for allowing [place] to provide your care. Today we're not starting any new medications. Please work on modifying her diet as outlined below. Also try to incorporate exercise into your daily routine.   For your belly pain I am ordering a test to look at your gallbladder. It will be important that you follow-up with this. I will call you when I have these results. ___________________________________________________________  [place] Pert por permitirnos brindarle atencin. Hoy no comenzamos con ningn medicamento nuevo. Trabaje en modificar su dieta como se describe a continuacin. Tambin intente incorporar el ejercicio en su rutina diaria. Me gustara verte de nuevo en un ao o antes si surge algn problema.  Para su dolor de estmago, estoy solicitando una prueba para observar su vescula biliar. Ser importante que haga un seguimiento de esto. Te llamar cuando [employer].  Colesterol elevado High Cholesterol  El colesterol elevado es una afeccin en la que la sangre tiene niveles altos de una sustancia [place], cerosa y parecida a la grasa (colesterol). El organismo humano necesita una pequea cantidad de colesterol. El hgado fabrica todo el colesterol que el organismo necesita. El exceso de colesterol proviene de los alimentos que comemos. La sangre transporta el colesterol desde el hgado a travs de los vasos sanguneos. Si tiene el colesterol elevado, este puede depositarse (formar placas) en las paredes de los vasos sanguneos (arterias). Las Occupational hygienist y la rigidez [place] arterias. Las placas de colesterol aumentan el riesgo de sufrir un infarto de miocardio y un accidente cerebrovascular. Trabaje con el mdico para [employer] concentraciones de colesterol en un rango saludable. Qu incrementa el riesgo? Es ms probable que Orthoptist en las personas que:  Consumen alimentos con alto contenido de grasa animal (grasa saturada) o  colesterol.  Tienen sobrepeso.  No hacen suficiente ejercicio fsico.  Tienen antecedentes familiares de colesterol elevado. Cules son los signos o los sntomas? Esta afeccin no presenta sntomas. Cmo se diagnostica? Esta afeccin podra diagnosticarse a [place] de anlisis de [place].  Si es mayor de 20aos, es posible que el mdico le controle el colesterol cada [id number].  Los controles pueden ser ms frecuentes si ya tuvo el colesterol elevado u otros factores de riesgo de enfermedades cardacas. En el anlisis de sangre de [place], se determina lo siguiente:  El colesterol "malo" (colesterol LDL). Este es el principal tipo de colesterol que causa enfermedades cardacas. El nivel recomendado de LDL es de menos de100.  El colesterol "bueno" (colesterol HDL). Este tipo ayuda a Tour manager las enfermedades [place] arterias y arrastrando el LDL. El nivel recomendado de HDL es de60 o superior.  Triglicridos. Estos son grasas que el organismo puede Financial controller o quemar como fuente de [place]. El nivel recomendado de triglicridos es de menos de 150.  Colesterol total. Esta es una medicin de la cantidad total de colesterol en la sangre, que incluye el colesterol LDL, el colesterol HDL y los triglicridos. El valor saludable es de menos de200. Cmo se trata? Esta afeccin se trata con cambios en la dieta y en el estilo de vida, y con medicamentos. Cambios en la [employer], la ingesta de una mayor cantidad de cereales integrales, frutas, verduras, frutos secos y pescado.  Tambin podran incluir la reduccin del consumo de carnes rojas y alimentos con Architectural technologist. Cambios en el estilo de vida  Entre ellos, realizar sesiones de ejercicios aerbicos durante, por lo menos, 22minutos, 3veces por semana.  Por ejemplo, caminar, andar en bicicleta y nadar. Los ejercicios aerbicos junto con una dieta sana pueden ayudar a que se  Quarry manager en un peso saludable.  Los cambios tambin podran incluir dejar de fumar. Medicamentos  Por lo general, se administran medicamentos si con los Licensed conveyancer y en el estilo de vida no se logra reducir el colesterol hasta niveles saludables.  El mdico podra recetarle estatinas. Se ha demostrado que las estatinas [employer] niveles de [place], lo que puede reducir el riesgo de Insurance risk surveyor una enfermedad cardaca. Siga estas indicaciones en su casa: Comida y bebida Si se lo indic el mdico:  Coma pollo (sin piel), pescado, ternera, mariscos, pechuga de [place] y cortes de carne roja de pulpa o de lomo.  No coma alimentos fritos ni carnes grasosas, como salchichas y salame.  Coma muchas frutas, como manzanas.  Coma gran cantidad de verduras, como brcoli, papas y zanahorias.  Coma porotos, guisantes secos y lentejas.  Coma cereales, como cebada, arroz, cuscs y trigo burgol.  Coma pastas sin salsas con crema.  Rea, y coma yogures y quesos descremados o semidescremados.  No coma ni beba Mattel, crema, helado, yemas de huevo ni quesos duros.  No coma margarinas en barra ni untables que contengan grasas trans (que tambin se conocen como aceites parcialmente hidrogenados).  No coma aceites tropicales saturados, como el de coco y el de [place].  No coma tortas, galletas, galletitas ni otros productos horneados que contengan grasas trans.  Instrucciones generales  Haga ejercicio segn las indicaciones del mdico. Aumente la cantidad de ejercicio fsico que realiza mediante actividades como jardinera, salir a Writer o usar las escaleras.  Tome los medicamentos de venta libre y los recetados solamente como se lo haya indicado el mdico.  No consuma ningn producto que contenga nicotina o tabaco, como cigarrillos y Psychologist, sport and exercise. Si necesita ayuda para dejar de fumar, consulte al mdico.  Concurra a  todas las visitas de control como se lo haya indicado el mdico. Esto es importante. Comunquese con un mdico si:  Tiene dificultad para seguir una dieta sana o mantener un peso saludable.  Necesita ayuda para comenzar un programa de ejercicios.  Necesita ayuda para dejar de fumar. Solicite ayuda de inmediato si:  Electronics engineer.  Tiene dificultad para respirar. Esta informacin no tiene Marine scientist el consejo del mdico. Asegrese de hacerle al mdico cualquier pregunta que tenga. Document Revised: 05/04/2016 Document Reviewed: 07/27/2015 Elsevier Patient Education  [place].

## 2019-02-22 NOTE — Assessment & Plan Note (Signed)
Patient referred to clinic for evaluation of hyperlipidemia. I reviewed her prior lab results from 02/08/2019. Total cholesterol elevated to 248, triglycerides elevated to 201, and LDL elevated to 159.  She is not currently on treatment for hypertension or history of diabetes. She is a never smoker. She has no family history of early heart disease. She has no claudication symptoms.  Calculated ASCVD risk score calculated to 1.1%. Discussed diet and lifestyle modifications. No indication for statin therapy at this time.

## 2019-02-22 NOTE — Progress Notes (Signed)
   CC: Hyperlipidemia, RUQ  HPI:  Chelsea Yates is a 44 y.o. female with PMHx listed below presenting for hyperlipidemia, RUQ. Please see the A&P for the status of the patient's chronic medical problems.  Past Medical History:  Diagnosis Date  . GERD (gastroesophageal reflux disease)    no meds  . Hyperlipidemia   . Postpartum care following vaginal delivery 09/30/2010   Past Surgical History:  Procedure Laterality Date  . DILATION AND CURETTAGE OF UTERUS    . DILATION AND EVACUATION N/A 04/14/2013   Procedure: DILATATION AND CURETTAGE;  Surgeon: Emily Filbert, MD;  Location: [facility];  Service: Gynecology;  Laterality: N/A;   Family History: Mother has a history of hyperlipidemia. No family history of early MI or CVA.  Social History: Four children. Currently healthy. 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 Denies the use of tobacco, alcohol, or illicit substances.  Review of Systems:  Performed and all others negative.  Physical Exam: Vitals:   02/22/19 1335  BP: 127/64  Pulse: 66  Temp: 98.5 F (36.9 C)  TempSrc: Oral  SpO2: 100%  Weight: 147 lb 6.4 oz (66.9 kg)   General: Well nourished female in no acute distress HENT: Normocephalic, atraumatic, moist mucus membranes Pulm: Good air movement with no wheezing or crackles  CV: RRR, no murmurs, no rubs  Abdomen: Active bowel sounds, soft, non-distended, tenderness to palpation of the RUQ Extremities: Pulses palpable in all extremities, no LE edema  Skin: Warm and dry  Neuro: Alert and oriented x 3  Assessment & Plan:   See Encounters Tab for problem based charting.  Patient seen with Dr. Evette Doffing

## 2019-02-23 NOTE — Progress Notes (Signed)
Internal Medicine Clinic Attending  I saw and evaluated the patient.  I personally confirmed the key portions of the history and exam documented by Dr. Helberg and I reviewed pertinent patient test results.  The assessment, diagnosis, and plan were formulated together and I agree with the documentation in the resident's note. 

## 2019-02-23 NOTE — Addendum Note (Signed)
Addended by: Lalla Brothers T on: 02/23/2019 08:45 AM   Modules accepted: Level of Service

## 2019-03-13 ENCOUNTER — Ambulatory Visit (INDEPENDENT_AMBULATORY_CARE_PROVIDER_SITE_OTHER): Payer: Self-pay | Admitting: Internal Medicine

## 2019-03-13 ENCOUNTER — Encounter: Payer: Self-pay | Admitting: Internal Medicine

## 2019-03-13 ENCOUNTER — Other Ambulatory Visit: Payer: Self-pay

## 2019-03-13 VITALS — BP 127/58 | HR 74 | Temp 98.0°F | Ht 60.5 in | Wt 144.6 lb

## 2019-03-13 DIAGNOSIS — R1011 Right upper quadrant pain: Secondary | ICD-10-CM

## 2019-03-13 DIAGNOSIS — E785 Hyperlipidemia, unspecified: Secondary | ICD-10-CM

## 2019-03-13 NOTE — Progress Notes (Signed)
   CC: RUQ pain   HPI:  Chelsea Yates is a 44 y.o. community dwelling Spanish-speaking woman with hyperlipidemia here to follow-up on right upper quadrant pain.  Please see problem based charting for further details.  Past Medical History:  Diagnosis Date  . GERD (gastroesophageal reflux disease)    no meds  . Hyperlipidemia   . Postpartum care following vaginal delivery 09/30/2010   Review of Systems:  As per HPI  Physical Exam:  Vitals:   03/13/19 1326  BP: (!) 127/58  Pulse: 74  Temp: 98 F (36.7 C)  TempSrc: Oral  SpO2: 100%  Weight: 144 lb 9.6 oz (65.6 kg)  Height: 5' 0.5" (1.537 m)   Physical Exam  Constitutional: She is well-developed, well-nourished, and in no distress.  HENT:  Head: Normocephalic and atraumatic.  Eyes: Conjunctivae are normal.  Abdominal: Soft. Bowel sounds are normal. She exhibits no distension. There is abdominal tenderness (RUQ ). There is no rebound and no guarding.  Psychiatric: Mood and affect normal.      Assessment & Plan:   See Encounters Tab for problem based charting.  Patient discussed with Dr. Philipp Ovens

## 2019-03-13 NOTE — Patient Instructions (Signed)
Ms Chelsea Yates,   Thanks for seeing [place] today. I am checking blood work today. We will set you an appointment to see the financial counselor. Please decrease the amount of greasy food that you eat.   Thanks!

## 2019-03-13 NOTE — Assessment & Plan Note (Addendum)
Right upper quadrant pain: She was evaluated in the clinic on February 22, 2019 by one of my colleagues Dr. Tarri Abernethy with complaints of postprandial right upper quadrant pain.  She presented to the clinic today under the impression of obtaining official right upper quadrant ultrasound in the clinic today.  She continues to endorse postprandial right upper quadrant pain which occurs about 1 to 2 hours after eating.  Sometimes, she experiences intermittent nausea as well as chills but denies fevers, vomiting.  There has not been any changes with her bowel movements.  She is currently uninsured and pending financial assistance from our clinic.  On physical exams, she did have right upper quadrant tenderness.  Plan of care ultrasound was performed and did not reveal any obvious cholelithiasis.    Assessment: Biliary colic cannot be ruled out however point-of-care ultrasound performed in the clinic is reassuring as there was no obvious gallstones seen.  Plan: -She has an appointment to see our financial counselor on March 29, 2019 -Advised to decrease greasy food and fat intake -Follow up CMP

## 2019-03-14 ENCOUNTER — Telehealth: Payer: Self-pay | Admitting: Internal Medicine

## 2019-03-14 LAB — CMP14 + ANION GAP
ALT: 14 IU/L (ref 0–32)
AST: 20 IU/L (ref 0–40)
Albumin/Globulin Ratio: 1.9 (ref 1.2–2.2)
Albumin: 4.7 g/dL (ref 3.8–4.8)
Alkaline Phosphatase: 55 IU/L (ref 39–117)
Anion Gap: 15 mmol/L (ref 10.0–18.0)
BUN/Creatinine Ratio: 20 (ref 9–23)
BUN: 13 mg/dL (ref 6–24)
Bilirubin Total: 0.5 mg/dL (ref 0.0–1.2)
CO2: 21 mmol/L (ref 20–29)
Calcium: 9.5 mg/dL (ref 8.7–10.2)
Chloride: 103 mmol/L (ref 96–106)
Creatinine, Ser: 0.64 mg/dL (ref 0.57–1.00)
GFR calc Af Amer: 126 mL/min/{1.73_m2} (ref >59)
GFR calc non Af Amer: 110 mL/min/{1.73_m2} (ref >59)
Globulin, Total: 2.5 g/dL (ref 1.5–4.5)
Glucose: 79 mg/dL (ref 65–99)
Potassium: 4.6 mmol/L (ref 3.5–5.2)
Sodium: 139 mmol/L (ref 134–144)
Total Protein: 7.2 g/dL (ref 6.0–8.5)

## 2019-03-14 NOTE — Telephone Encounter (Signed)
Called [name] regarding labs using Engineer, manufacturing systems.

## 2019-03-16 ENCOUNTER — Ambulatory Visit (HOSPITAL_COMMUNITY): Payer: No Typology Code available for payment source

## 2019-03-16 ENCOUNTER — Other Ambulatory Visit: Payer: Self-pay | Admitting: Internal Medicine

## 2019-03-16 DIAGNOSIS — R1013 Epigastric pain: Secondary | ICD-10-CM

## 2019-03-16 MED ORDER — OMEPRAZOLE 40 MG PO CPDR: 40.0000 mg | DELAYED_RELEASE_CAPSULE | Freq: Every day | ORAL | 3 refills | Status: DC

## 2019-03-16 NOTE — Progress Notes (Signed)
Internal Medicine Clinic Attending  I saw and evaluated the patient.  I personally confirmed the key portions of the history and exam documented by Dr. Eileen Stanford and I reviewed pertinent patient test results.  The assessment, diagnosis, and plan were formulated together and I agree with the documentation in the resident's note.    Given reassuring POC ultrasound and CMP which was WNL, would consider starting empiric PPI for possible dyspepsia.

## 2019-03-29 ENCOUNTER — Ambulatory Visit: Payer: No Typology Code available for payment source

## 2019-04-17 ENCOUNTER — Ambulatory Visit: Payer: No Typology Code available for payment source

## 2019-05-08 ENCOUNTER — Ambulatory Visit: Payer: No Typology Code available for payment source | Attending: Internal Medicine

## 2019-05-08 DIAGNOSIS — Z23 Encounter for immunization: Secondary | ICD-10-CM

## 2019-05-08 NOTE — Progress Notes (Signed)
   Covid-19 Vaccination Clinic  Name:  Jasha Brozek    MRN: [id number] DOB: 1975-03-19  05/08/2019  Ms. Tranita Terry was observed post Covid-19 immunization for 15 minutes without incident. She was provided with Vaccine Information Sheet and instruction to access the V-Safe system.   Ms. Quynn Shird was instructed to call 911 with any severe reactions post vaccine: Marland Kitchen Difficulty breathing  . Swelling of face and throat  . A fast heartbeat  . A bad rash all over body  . Dizziness and weakness   Immunizations Administered    Name Date Dose VIS Date Route   Pfizer COVID-19 Vaccine 05/08/2019  8:31 AM 0.3 mL 01/20/2019 Intramuscular   Manufacturer: [place]   Lot: [id number]   [place]: [id number]

## 2019-05-31 ENCOUNTER — Ambulatory Visit: Payer: No Typology Code available for payment source | Attending: Internal Medicine

## 2019-05-31 DIAGNOSIS — Z23 Encounter for immunization: Secondary | ICD-10-CM

## 2019-05-31 NOTE — Progress Notes (Signed)
   Covid-19 Vaccination Clinic  Name:  Chelsea Yates    MRN: [id number] DOB: Jan 27, 1976  05/31/2019  [name] was observed post Covid-19 immunization for 15 minutes without incident. She was provided with Vaccine Information Sheet and instruction to access the V-Safe system.   [name] was instructed to call 911 with any severe reactions post vaccine: Marland Kitchen Difficulty breathing  . Swelling of face and throat  . A fast heartbeat  . A bad rash all over body  . Dizziness and weakness   Immunizations Administered    Name Date Dose VIS Date Route   Pfizer COVID-19 Vaccine 05/31/2019  8:32 AM 0.3 mL 04/05/2018 Intramuscular   Manufacturer: [place]   Lot: [id number]   [place]: [id number]

## 2019-06-29 ENCOUNTER — Encounter: Payer: Self-pay | Admitting: Nurse Practitioner

## 2019-07-21 ENCOUNTER — Ambulatory Visit: Payer: No Typology Code available for payment source | Admitting: Nurse Practitioner

## 2019-09-06 ENCOUNTER — Ambulatory Visit: Payer: No Typology Code available for payment source | Admitting: Physician Assistant

## 2019-09-06 ENCOUNTER — Encounter: Payer: Self-pay | Admitting: Physician Assistant

## 2019-09-06 ENCOUNTER — Telehealth: Payer: Self-pay

## 2019-09-06 ENCOUNTER — Other Ambulatory Visit (INDEPENDENT_AMBULATORY_CARE_PROVIDER_SITE_OTHER): Payer: No Typology Code available for payment source

## 2019-09-06 VITALS — BP 120/70 | HR 60 | Ht 59.0 in | Wt 145.5 lb

## 2019-09-06 DIAGNOSIS — R1031 Right lower quadrant pain: Secondary | ICD-10-CM

## 2019-09-06 DIAGNOSIS — R1011 Right upper quadrant pain: Secondary | ICD-10-CM

## 2019-09-06 DIAGNOSIS — R1013 Epigastric pain: Secondary | ICD-10-CM

## 2019-09-06 DIAGNOSIS — R194 Change in bowel habit: Secondary | ICD-10-CM

## 2019-09-06 LAB — COMPREHENSIVE METABOLIC PANEL
ALT: 11 U/L (ref 0–35)
AST: 15 U/L (ref 0–37)
Albumin: 4.5 g/dL (ref 3.5–5.2)
Alkaline Phosphatase: 51 U/L (ref 39–117)
BUN: 17 mg/dL (ref 6–23)
CO2: 26 mEq/L (ref 19–32)
Calcium: 9.7 mg/dL (ref 8.4–10.5)
Chloride: 105 mEq/L (ref 96–112)
Creatinine, Ser: 0.68 mg/dL (ref 0.40–1.20)
GFR: 93.84 mL/min (ref >60.00)
Glucose, Bld: 92 mg/dL (ref 70–99)
Potassium: 4.1 mEq/L (ref 3.5–5.1)
Sodium: 136 mEq/L (ref 135–145)
Total Bilirubin: 0.4 mg/dL (ref 0.2–1.2)
Total Protein: 7.5 g/dL (ref 6.0–8.3)

## 2019-09-06 LAB — CBC WITH DIFFERENTIAL/PLATELET
Basophils Absolute: 0 10*3/uL (ref 0.0–0.1)
Basophils Relative: 0.5 % (ref 0.0–3.0)
Eosinophils Absolute: 0.1 10*3/uL (ref 0.0–0.7)
Eosinophils Relative: 1.9 % (ref 0.0–5.0)
HCT: 36.9 % (ref 36.0–46.0)
Hemoglobin: 12.1 g/dL (ref 12.0–15.0)
Lymphocytes Relative: 24.3 % (ref 12.0–46.0)
Lymphs Abs: 1.7 10*3/uL (ref 0.7–4.0)
MCHC: 32.7 g/dL (ref 30.0–36.0)
MCV: 78.8 fl (ref 78.0–100.0)
Monocytes Absolute: 0.6 10*3/uL (ref 0.1–1.0)
Monocytes Relative: 8.3 % (ref 3.0–12.0)
Neutro Abs: 4.4 10*3/uL (ref 1.4–7.7)
Neutrophils Relative %: 65 % (ref 43.0–77.0)
Platelets: 301 10*3/uL (ref 150.0–400.0)
RBC: 4.69 Mil/uL (ref 3.87–5.11)
RDW: 14.8 % (ref 11.5–15.5)
WBC: 6.8 10*3/uL (ref 4.0–10.5)

## 2019-09-06 LAB — SEDIMENTATION RATE: Sed Rate: 24 mm/hr — ABNORMAL HIGH (ref 0–20)

## 2019-09-06 MED ORDER — OMEPRAZOLE 40 MG PO CPDR: 40.0000 mg | DELAYED_RELEASE_CAPSULE | Freq: Every day | ORAL | 1 refills | Status: DC

## 2019-09-06 MED ORDER — DICYCLOMINE HCL 10 MG PO CAPS: 10.0000 mg | ORAL_CAPSULE | Freq: Three times a day (TID) | ORAL | 2 refills | Status: DC

## 2019-09-06 NOTE — Progress Notes (Signed)
Subjective:    Patient ID: Chelsea Yates, female    DOB: 10-14-1975, 44 y.o.   MRN: [id number]  HPI Chelsea Yates is a pleasant 44 year old Hispanic female, new to GI today and referred by [facility]  internal medicine/Dr. Court Joy for evaluation of right upper quadrant pain.  Patient is otherwise generally healthy.  She has not had any prior GI evaluation. She did have a recent limited ultrasound done at the medicine clinic which in the notes was reported as negative for gallstones.  This was not interpreted by radiology. Patient says she has been having recurrent symptoms over the past couple of years.  Previously symptoms had been intermittent but over the past couple of months have become more constant and present on a daily basis.  She describes the pain as being burning and contraction-like in nature.  She feels that eating generally will help her symptoms and she feels worse on an empty stomach most of the time.  She has had some nausea without vomiting.  She has noted some alteration in her bowel habits with intermittent constipation and diarrhea.  She uses occasional NSAIDs but not on a regular basis.  No prior abdominal surgery.  She has been on 2 courses of omeprazole 40 mg daily.  She has noticed some mild improvement of symptoms but definitely not significantly improving her symptoms.  Appetite has been okay weight overall stable.  She mentions that she has noticed occasional dark stools. Family history negative for GI disease as far she is aware.  Review of Systems.Pertinent positive and negative review of systems were noted in the above HPI section.  All other review of systems was otherwise negative.  Outpatient Encounter Medications as of 09/06/2019  Medication Sig  . omeprazole (PRILOSEC) 40 MG capsule Take 1 capsule (40 mg total) by mouth daily.  . [DISCONTINUED] omeprazole (PRILOSEC) 40 MG capsule Take 1 capsule (40 mg total) by mouth daily.  Marland Kitchen dicyclomine (BENTYL) 10 MG capsule Take 1 capsule  (10 mg total) by mouth 4 (four) times daily -  before meals and at bedtime.  . [DISCONTINUED] Prenatal Vit-Fe Fumarate-FA (PRENATAL MULTIVITAMIN) TABS tablet Take 1 tablet by mouth daily at 12 noon.   No facility-administered encounter medications on file as of 09/06/2019.   No Known Allergies Patient Active Problem List   Diagnosis Date Noted  . Hyperlipidemia 02/22/2019  . RUQ pain 02/22/2019  . Screening breast examination 01/03/2019  . Postpartum care following vaginal delivery 09/30/2010   Social History   Socioeconomic History  . Marital status: Married    Spouse name: Not on file  . Number of children: 4  . Years of education: Not on file  . Highest education level: 6th grade  Occupational History  . Occupation: Camera operator  Tobacco Use  . Smoking status: Never Smoker  . Smokeless tobacco: Never Used  Vaping Use  . Vaping Use: Never used  Substance and Sexual Activity  . Alcohol use: No  . Drug use: No  . Sexual activity: Yes    Birth control/protection: Condom  Other Topics Concern  . Not on file  Social History Narrative  . Not on file   Social Determinants of Health   Financial Resource Strain:   . Difficulty of Paying Living Expenses:   Food Insecurity:   . Worried About Charity fundraiser in the Last Year:   . Arboriculturist in the Last Year:   Transportation Needs: No Transportation Needs  . Lack of  Transportation (Medical): No  . Lack of Transportation (Non-Medical): No  Physical Activity:   . Days of Exercise per Week:   . Minutes of Exercise per Session:   Stress:   . Feeling of Stress :   Social Connections:   . Frequency of Communication with Friends and Family:   . Frequency of Social Gatherings with Friends and Family:   . Attends Religious Services:   . Active Member of Clubs or Organizations:   . Attends Archivist Meetings:   Marland Kitchen Marital Status:   Intimate Partner Violence:   . Fear of Current or Ex-Partner:   .  Emotionally Abused:   Marland Kitchen Physically Abused:   . Sexually Abused:     Chelsea Yates's family history includes Hypertension in her mother; Thyroid disease in her son; Uterine cancer in her maternal grandmother.      Objective:    Vitals:   09/06/19 1513  BP: 120/70  Pulse: 60    Physical Exam Well-developed well-nourished Hispanic female in no acute distress.  Height, Weight 145, BMI 29.3  HEENT; nontraumatic normocephalic, EOMI, PER R LA, sclera anicteric. Oropharynx; not examined Neck; supple, no JVD Cardiovascular; regular rate and rhythm with S1-S2, no murmur rub or gallop Pulmonary; Clear bilaterally Abdomen; soft, she is quite tender in the right upper quadrant right mid quadrant and right lower quadrant no guarding or rebound nondistended, no palpable mass or hepatosplenomegaly, bowel sounds are active Rectal; not done today Skin; benign exam, no jaundice rash or appreciable lesions Extremities; no clubbing cyanosis or edema skin warm and dry Neuro/Psych; alert and oriented x4, grossly nonfocal mood and affect appropriate       Assessment & Plan:   #71 44 year old Hispanic female with 2-year history of intermittent right-sided abdominal pain, progressive and now constant over the past few months.  On exam she is quite tender in the right upper right mid and right lower quadrant.  Pain is described as burning and cramping in nature, and sometimes improved with p.o. intake. Etiology of symptoms is not clear, rule out chronic gastropathy, peptic ulcer disease, H. pylori, inflammatory bowel disease, doubt gallbladder disease but cannot rule out, rule out other intra-abdominal inflammatory process  Plan;  will continue omeprazole 40 mg p.o. every morning AC breakfast, for now Add Bentyl 10 mg p.o. 3 times daily as needed for abdominal pain/cramping CBC with differential, be met, sed rate, H. pylori antibody Patient will be scheduled for CT of the abdomen pelvis with  contrast. Further plans pending results of above.  Patient will be scheduled for follow-up office visit in about 3 weeks. Patient will be established with Dr. Lucille Passy PA-C 09/06/2019   Cc: Madalyn Rob, MD

## 2019-09-06 NOTE — Patient Instructions (Addendum)
If you are age 44 or older, your body mass index should be between 23-30. Your Body mass index is 29.39 kg/m. If this is out of the aforementioned range listed, please consider follow up with your Primary Care Provider.  If you are age 28 or younger, your body mass index should be between 19-25. Your Body mass index is 29.39 kg/m. If this is out of the aformentioned range listed, please consider follow up with your Primary Care Provider.   Your provider has requested that you go to the basement level for lab work before leaving today. Press "B" on the elevator. The lab is located at the first door on the left as you exit the elevator.  Continue Omeprazole 40 mg 1 capsule before breakfast. START Bentyl 10 mg 3 times a day as need for abdominal pain.  You have been scheduled for a CT scan of the abdomen and pelvis at [facility], [address] . You are scheduled on 09/22/19  at 10:00 am. You should arrive 20 minutes prior to your appointment time for registration. Please follow the written instructions below on the day of your exam:    1) Do not eat anything after 6:00 am (4 hours prior to your test)  2) Please go by [place] to pick up your instructions and contrast. (At least 3 days prior to your procedure you will need to pick up).  The solution may taste better if refrigerated, but do NOT add ice or any other liquid to this solution. Shake well before drinking.   The purpose of you drinking the oral contrast is to aid in the visualization of your intestinal tract. The contrast solution may cause some diarrhea. Depending on your individual set of symptoms, you may also receive an intravenous injection of x-ray contrast/dye. Plan on being at [facility] for 45 minutes or longer, depending on the type of exam you are having performed.   If you have any questions regarding your exam or if you need to reschedule, you may call [facility]  at [phone] between the  hours of 8:00 am and 5:00 pm, Monday-Friday.    You have been scheduled to follow up with Chelsea Yates On October 05, 2019 at 2:00 pm

## 2019-09-06 NOTE — Telephone Encounter (Signed)
Health Coaching 3   interpreter- Rudene Anda, [facility]   Goals- Patient has added more fruits and vegetables into daily diet. Patient stated that she is currently eating on average 2 cups of fruit and 2 cups of vegetables daily. She has also cut out all fried and fatty foods in her diet.    New goal- Patient is still not exercising.   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    Strategies to overcome- Tried to encourage patient to try and start with some short walks like for maybe 10 minutes at a time on days when she has enough energy to walk. The ultimate goal would be to try and work her way up to 20 minutes a day.    Navigation:  Patient is aware of  a follow up session. Patient is scheduled for follow-up visit on Wednesday, August 25th @ 10:00 am.   Time- 10 minutes

## 2019-09-07 LAB — H. PYLORI ANTIBODY, IGG: H Pylori IgG: NEGATIVE

## 2019-09-22 ENCOUNTER — Ambulatory Visit
Admission: RE | Admit: 2019-09-22 | Discharge: 2019-09-22 | Disposition: A | Discharge status: Home / Self Care | Payer: Self-pay | Source: Ambulatory Visit | Attending: Physician Assistant | Admitting: Physician Assistant

## 2019-09-22 ENCOUNTER — Other Ambulatory Visit: Payer: Self-pay

## 2019-09-22 DIAGNOSIS — R1011 Right upper quadrant pain: Secondary | ICD-10-CM

## 2019-09-22 DIAGNOSIS — R194 Change in bowel habit: Secondary | ICD-10-CM

## 2019-09-22 DIAGNOSIS — R1031 Right lower quadrant pain: Secondary | ICD-10-CM

## 2019-09-22 MED ORDER — IOPAMIDOL (ISOVUE-300) INJECTION 61%
100.0000 mL | Freq: Once | INTRAVENOUS | Status: AC | PRN
  Administered 2019-09-22: 100 mL via INTRAVENOUS

## 2019-10-02 NOTE — Progress Notes (Signed)
Addendum: Reviewed and agree with assessment and management plan. Donnice Nielsen M, MD  

## 2019-10-04 ENCOUNTER — Inpatient Hospital Stay: Payer: Self-pay | Attending: Obstetrics and Gynecology | Admitting: *Deleted

## 2019-10-04 ENCOUNTER — Other Ambulatory Visit: Payer: Self-pay

## 2019-10-04 ENCOUNTER — Ambulatory Visit: Payer: No Typology Code available for payment source

## 2019-10-04 VITALS — BP 129/67 | Ht 60.5 in | Wt 146.0 lb

## 2019-10-04 DIAGNOSIS — Z1231 Encounter for screening mammogram for malignant neoplasm of breast: Secondary | ICD-10-CM

## 2019-10-04 DIAGNOSIS — Z0000 Encounter for general adult medical examination without abnormal findings: Secondary | ICD-10-CM

## 2019-10-04 NOTE — Progress Notes (Signed)
[facility] follow up   Interpreter: Rudene Anda, [facility]   Clinical Measurement:   Vitals:   10/04/19 0852 10/04/19 1419  BP: 110/68 129/67      Medical History:  Patient states that she has high cholesterol, does not have high blood pressure and she does not have diabetes.  Medications:  Patient states that she does not take medication to lower cholesterol, blood pressure and blood sugar.  Patient does not take an aspirin a day to help prevent a heart attack or stroke.   Blood pressure, self measurement: Patient states that she does not measure blood pressure from home. She checks her blood pressure N/A. She shares her readings with a health care provider: N/A.   Nutrition: Patient states that on average she eats 2 cups of fruit and 1 cups of vegetables per day. Patient states that she does not eat fish at least 2 times per week. Patient eats about half servings of whole grains. Patient drinks less than 36 ounces of beverages with added sugar weekly: yes. Patient is currently watching sodium or salt intake: yes. In the past 7 days patient has had 0 drinks containing alcohol. On average patient drinks 0 drinks containing alcohol per day.      Physical activity:  Patient states that she gets 0 minutes of moderate and 0 minutes of vigorous physical activity each week.  Smoking status:  Patient states that she has has never smoked .   Quality of life:  Over the past 2 weeks patient states that she had little interest or pleasure in doing things: not at all. She has been feeling down, depressed or hopeless:not at all.    Risk reduction and counseling:   Health Coaching: Encouraged patient to continue watching the amount of fried and fatty foods consumed. Encouraged patient to try and grill, bake, broil or sautee foods instead. We also discussed the importance of whole grains and heart healthy fish for lowering cholesterol levels. Encouraged patient to also try to start exercising daily, with a  goal of 20 minutes per day. Also spoke with patient add more vegetables in daily diet, explained that the recommendation is for 3 cups of vegetables per day.   Navigation: This was the  follow up session for this patient, I will check up on her progress in the coming months.  Time: 20 minutes

## 2019-10-05 ENCOUNTER — Ambulatory Visit: Payer: No Typology Code available for payment source | Admitting: Physician Assistant

## 2019-10-05 ENCOUNTER — Encounter: Payer: Self-pay | Admitting: Physician Assistant

## 2019-10-05 VITALS — BP 114/70 | HR 66 | Ht 59.25 in | Wt 146.2 lb

## 2019-10-05 DIAGNOSIS — G8929 Other chronic pain: Secondary | ICD-10-CM

## 2019-10-05 DIAGNOSIS — R1013 Epigastric pain: Secondary | ICD-10-CM

## 2019-10-05 DIAGNOSIS — R1011 Right upper quadrant pain: Secondary | ICD-10-CM

## 2019-10-05 DIAGNOSIS — R1031 Right lower quadrant pain: Secondary | ICD-10-CM

## 2019-10-05 DIAGNOSIS — N920 Excessive and frequent menstruation with regular cycle: Secondary | ICD-10-CM

## 2019-10-05 DIAGNOSIS — N83202 Unspecified ovarian cyst, left side: Secondary | ICD-10-CM

## 2019-10-05 HISTORY — DX: Other chronic pain: G89.29

## 2019-10-05 HISTORY — DX: Right lower quadrant pain: R10.31

## 2019-10-05 MED ORDER — DICYCLOMINE HCL 10 MG PO CAPS: 10.0000 mg | ORAL_CAPSULE | Freq: Three times a day (TID) | ORAL | 4 refills | Status: DC | PRN

## 2019-10-05 MED ORDER — OMEPRAZOLE 40 MG PO CPDR: 40.0000 mg | DELAYED_RELEASE_CAPSULE | Freq: Every day | ORAL | 4 refills | Status: DC

## 2019-10-05 NOTE — Progress Notes (Signed)
Subjective:    Patient ID: Chelsea Yates, female    DOB: [birth year], 44 y.o.   MRN: [id number]  HPI Chelsea Yates is a [age]-year-old Hispanic female, non-English-speaking who comes in today with an interpreter.  She was initially established care on 09/06/2019, seen by myself in nail established with Dr. Hilarie Fredrickson.  She has complaints of persistent right-sided abdominal pain over the past 2 years which had worsened over the past few months.  Pain was described as burning and cramping in nature. She has undergone CT of the abdomen and pelvis on 09/22/2019 which showed some focal fatty infiltration of the liver, she has a 2.7 cm left ovarian cyst and a possible small uterine fibroid, otherwise negative.   Labs were done with CBC, chemistries and H. pylori all unremarkable, sed rate 24. She was started on a course of omeprazole 40 mg p.o. every morning and dicyclomine 10 mg 3 times daily. She says that the medications are definitely helping and her pain is significantly less but when the medication wears off the pain will recur.  Again not as uncomfortable as she had been a couple of months ago. She mentions that she has been having very heavy menstrual periods over the past few months with clotting. There is very concerned about the findings on CT scan regarding her uterus and ovaries and this was reviewed in detail.  She is very worried about her abdominal pain because she has family history of cancer, specifically ovarian cancer in her grandmother. She is not having any problems with diarrhea or constipation, no melena or hematochezia.    Review of Systems Pertinent positive and negative review of systems were noted in the above HPI section.  All other review of systems was otherwise negative.  Outpatient Encounter Medications as of 10/05/2019  Medication Sig   dicyclomine (BENTYL) 10 MG capsule Take 1 capsule (10 mg total) by mouth 3 (three) times daily as needed for spasms (abdominal cramping).    omeprazole (PRILOSEC) 40 MG capsule Take 1 capsule (40 mg total) by mouth daily.   [DISCONTINUED] dicyclomine (BENTYL) 10 MG capsule Take 1 capsule (10 mg total) by mouth 4 (four) times daily -  before meals and at bedtime.   [DISCONTINUED] omeprazole (PRILOSEC) 40 MG capsule Take 1 capsule (40 mg total) by mouth daily.   No facility-administered encounter medications on file as of 10/05/2019.   No Known Allergies Patient Active Problem List   Diagnosis Date Noted   Abdominal pain, chronic, right lower quadrant 10/05/2019   Hyperlipidemia 02/22/2019   RUQ pain 02/22/2019   Screening breast examination 01/03/2019   Postpartum care following vaginal delivery 09/30/2010   Social History   Socioeconomic History   Marital status: Married    Spouse name: Not on file   Number of children: 4   Years of education: Not on file   Highest education level: 6th grade  Occupational History   Occupation: Education administrator services  Tobacco Use   Smoking status: Never Smoker   Smokeless tobacco: Never Used  Scientific laboratory technician Use: Never used  Substance and Sexual Activity   Alcohol use: No   Drug use: No   Sexual activity: Yes    Birth control/protection: Condom  Other Topics Concern   Not on file  Social History Narrative   Not on file   Social Determinants of Health   Financial Resource Strain:    Difficulty of Paying Living Expenses: Not on file  Food Insecurity:  Worried About Charity fundraiser in the Last Year: Not on file   [employer] of Food in the Last Year: Not on file  Transportation Needs: No Transportation Needs   Lack of Transportation (Medical): No   Lack of Transportation (Non-Medical): No  Physical Activity:    Days of Exercise per Week: Not on file   Minutes of Exercise per Session: Not on file  Stress:    Feeling of Stress : Not on file  Social Connections:    Frequency of Communication with Friends and Family: Not on file   Frequency of  Social Gatherings with Friends and Family: Not on file   Attends Religious Services: Not on file   Active Member of Clubs or Organizations: Not on file   Attends Archivist Meetings: Not on file   Marital Status: Not on file  Intimate Partner Violence:    Fear of Current or Ex-Partner: Not on file   Emotionally Abused: Not on file   Physically Abused: Not on file   Sexually Abused: Not on file    Ms. Linton Rump Lopez's family history includes Heart disease in her mother; Hypertension in her mother; Thyroid disease in her son; Uterine cancer in her maternal grandmother.      Objective:    Vitals:   10/05/19 1402  BP: 114/70  Pulse: 66    Physical Exam Well-developed well-nourished Hispanic female in no acute distress.  Height, Weight, 146 BMI 29.29  HEENT; nontraumatic normocephalic, EOMI, PE RR R LA, sclera anicteric. Oropharynx; not done Neck; supple, no JVD Cardiovascular; regular rate and rhythm with S1-S2, no murmur rub or gallop Pulmonary; Clear bilaterally Abdomen; soft, mild tenderness in the right lower and right mid quadrant nondistended, no palpable mass or hepatosplenomegaly, bowel sounds are active Rectal; not done today Skin; benign exam, no jaundice rash or appreciable lesions Extremities; no clubbing cyanosis or edema skin warm and dry Neuro/Psych; alert and oriented x4, grossly nonfocal mood and affect appropriate       Assessment & Plan:   #36 44 year old non-English speaking, Hispanic female with chronic right-sided abdominal discomfort over the past 2 years with worsening over the past couple of months. Initial work-up with CT scan and labs unremarkable.  She has had improvement in symptoms with omeprazole and dicyclomine though not total resolution. Etiology of the right lower and right mid quadrant abdominal pain is not entirely clear, suspect this may be secondary to IBS, and may have had some gastritis.  #2 menorrhagia 3.  Uterine  fibroid 4.  Small left ovarian cyst #5 anxiety  Plan; Long conversation with the patient regarding Findings.  Advise she needs to see a gynecologist.  She was given the number for the women's health clinic. We discussed colonoscopy and EGD as next step in GI evaluation if her right-sided abdominal pain is persisting or progressing.  She was reassured that work-up to date has been unremarkable. For now we will continue omeprazole 40 mg p.o. every morning Continue dicyclomine 10 mg p.o. 3 times daily AC as needed Patient is in the process of obtaining financial assistance through [facility] health. We will plan to see her back in 2 to 3 months, after she has had GYN evaluation, and decide regarding possibly pursuing endoscopic evaluation.   Jeannia Tatro Genia Harold PA-C 10/05/2019   Cc: Madalyn Rob, MD

## 2019-10-05 NOTE — Patient Instructions (Addendum)
If you are age 44 or older, your body mass index should be between 23-30. Your Body mass index is 29.29 kg/m. If this is out of the aforementioned range listed, please consider follow up with your Primary Care Provider.  If you are age 28 or younger, your body mass index should be between 19-25. Your Body mass index is 29.29 kg/m. If this is out of the aformentioned range listed, please consider follow up with your Primary Care Provider.   Refills of Omeprazole and Bentyl have been sent to your pharmacy.  Contact OB/GYBN at [phone] located at the [facility].  Follow up with Financial Assistance by calling [phone] (billing department).  Follow up with Amy in 2-3 months, please call the office and schedule in 1-2 months.

## 2019-10-09 NOTE — Progress Notes (Signed)
Addendum: Reviewed and agree with assessment and management plan. Josiephine Simao M, MD  

## 2019-11-27 ENCOUNTER — Encounter: Payer: Self-pay | Admitting: Obstetrics & Gynecology

## 2019-11-27 ENCOUNTER — Other Ambulatory Visit: Payer: Self-pay

## 2019-11-27 ENCOUNTER — Ambulatory Visit (INDEPENDENT_AMBULATORY_CARE_PROVIDER_SITE_OTHER): Payer: Self-pay | Admitting: Obstetrics & Gynecology

## 2019-11-27 VITALS — BP 128/59 | HR 67 | Ht 61.0 in | Wt 144.8 lb

## 2019-11-27 DIAGNOSIS — N83299 Other ovarian cyst, unspecified side: Secondary | ICD-10-CM

## 2019-11-27 DIAGNOSIS — N946 Dysmenorrhea, unspecified: Secondary | ICD-10-CM

## 2019-11-27 DIAGNOSIS — Z8759 Personal history of other complications of pregnancy, childbirth and the puerperium: Secondary | ICD-10-CM

## 2019-11-27 DIAGNOSIS — Z23 Encounter for immunization: Secondary | ICD-10-CM

## 2019-11-27 MED ORDER — IBUPROFEN 600 MG PO TABS: 600.0000 mg | ORAL_TABLET | Freq: Four times a day (QID) | ORAL | 1 refills | Status: DC | PRN

## 2019-11-27 NOTE — Progress Notes (Signed)
Patient ID: Chelsea Yates, female   DOB: [birth year], 44 y.o.   MRN: [id number]  Chief Complaint  Patient presents with  . Ovarian Cyst   Abdominal pain HPI Chelsea Yates is a 44 y.o. female.  [id number] Regular menses that last 5 days with cramping and clots. Tylenol doesn't help but OTC ibuprofen is much better but she was advised it could hurt her stomach. CT result 09/2019 reviewed and ovarian cyst and small fibroid was seen. HPI  Past Medical History:  Diagnosis Date  . Fibroids   . GERD (gastroesophageal reflux disease)    no meds  . Hyperlipidemia   . Postpartum care following vaginal delivery 09/30/2010    Past Surgical History:  Procedure Laterality Date  . DILATION AND CURETTAGE OF UTERUS    . DILATION AND EVACUATION N/A 04/14/2013   Procedure: DILATATION AND CURETTAGE;  Surgeon: Chelsea Filbert, MD;  Location: [facility];  Service: Gynecology;  Laterality: N/A;    Family History  Problem Relation Age of Onset  . Hypertension Mother   . Heart disease Mother   . Uterine cancer Maternal Grandmother   . Thyroid disease Son     Social History Social History   Tobacco Use  . Smoking status: Never Smoker  . Smokeless tobacco: Never Used  Vaping Use  . Vaping Use: Never used  Substance Use Topics  . Alcohol use: No  . Drug use: No    No Known Allergies  Current Outpatient Medications  Medication Sig Dispense Refill  . dicyclomine (BENTYL) 10 MG capsule Take 1 capsule (10 mg total) by mouth 3 (three) times daily as needed for spasms (abdominal cramping). 90 capsule 4  . ibuprofen (ADVIL) 600 MG tablet Take 1 tablet (600 mg total) by mouth every 6 (six) hours as needed. 30 tablet 1  . omeprazole (PRILOSEC) 40 MG capsule Take 1 capsule (40 mg total) by mouth daily. 30 capsule 4   No current facility-administered medications for this visit.    Review of Systems Review of Systems  Gastrointestinal: Positive for abdominal pain (RUQ) and constipation.  Genitourinary:  Positive for menstrual problem and pelvic pain (menstrual cramps). Negative for dyspareunia, vaginal bleeding and vaginal discharge.    Blood pressure (!) 128/59, pulse 67, height 5\' 1"  (1.549 m), weight 144 lb 12.8 oz (65.7 kg).  Physical Exam Physical Exam Constitutional:      Appearance: Normal appearance. She is not ill-appearing.  Pulmonary:     Effort: Pulmonary effort is normal.  Abdominal:     General: Abdomen is flat. There is no distension.     Palpations: Abdomen is soft.     Tenderness: There is no abdominal tenderness.  Genitourinary:    Comments: Pelvic deferred, pap done 03/2018 Neurological:     Mental Status: She is alert.     Data Reviewed CLINICAL DATA:  Chronic right lower quadrant pain for approximately 2 years. Chronic constipation.  EXAM: CT ABDOMEN AND PELVIS WITH CONTRAST  TECHNIQUE: Multidetector CT imaging of the abdomen and pelvis was performed using the standard protocol following bolus administration of intravenous contrast.  CONTRAST:  111mL ISOVUE-300 IOPAMIDOL (ISOVUE-300) INJECTION 61%  COMPARISON:  None.  FINDINGS: Lower Chest: No acute findings.  Hepatobiliary: No hepatic masses identified. Focal fatty infiltration seen adjacent to the falciform ligament. Gallbladder is unremarkable. No evidence of biliary ductal dilatation.  Pancreas:  No mass or inflammatory changes.  Spleen: Within normal limits in size and appearance.  Adrenals/Urinary Tract: No masses identified. No  evidence of ureteral calculi or hydronephrosis.  Stomach/Bowel: No evidence of obstruction, inflammatory process or abnormal fluid collections. Normal appendix visualized.  Vascular/Lymphatic: No pathologically enlarged lymph nodes. No abdominal aortic aneurysm.  Reproductive: A probable small fibroid is seen in the uterine fundus measuring 1.3 cm. A simple cyst is seen in the left adnexa measuring 2.7 cm in maximum diameter. No evidence of  free fluid.  Other:  None.  Musculoskeletal:  No suspicious bone lesions identified.  IMPRESSION: No evidence of appendicitis.  2.7 cm benign-appearing left ovarian cyst, most likely physiologic in a reproductive age female. No follow-up imaging recommended. This recommendation follows ACR consensus guidelines: White Paper of the ACR Incidental Findings Committee on Adnexal Findings. [place].  Probable tiny fibroid in uterine fundus.   Electronically Signed   By: Chelsea Yates M.D.   On: 09/22/2019 13:56  Assessment Dysmenorrhea - Plan: ibuprofen (ADVIL) 600 MG tablet, Flu Vaccine QUAD with presevative(FLUZONE)  History of molar pregnancy  Functional ovarian cysts small fibroid    Plan Benign functional cyst left ovary needs no f/u Dysmenorrhea and menorrhagia has responded to ibuprofen OTC prn, sent prescription and advised to use at start of her menstrual symptoms F/U with GI for possible IBS, she does get some relief with Bentyl    Chelsea Yates 11/27/2019, 10:16 AM

## 2019-11-27 NOTE — Progress Notes (Signed)
Lower right abdominal pain burning all over lower abdomen

## 2019-11-27 NOTE — Patient Instructions (Signed)
Fibromas uterinos Uterine Fibroids  Los fibromas uterinos (liomiomas) son tumores no cancerosos (benignos) que pueden Therapist, art. Los fibromas tambin pueden crecer en las trompas de [place], el cuello uterino o los tejidos (ligamentos) cerca del tero. Puede tener uno o muchos fibromas. Los fibromas tienen diferentes tamaos y pesos, y crecen en distintas partes del tero. Algunos pueden crecer hasta volverse bastante grandes. La mayora de los fibromas no requiere tratamiento mdico. Cules son las causas? Se desconoce la causa de esta afeccin. Qu incrementa el riesgo? Es ms probable que tenga esta afeccin si:  Tiene entre 30 y 98 aos de edad y no ha [employer].  Tiene antecedentes familiares de esta afeccin.  Tiene ascendencia afroamericana.  Tuvo su primer menstruacin a una edad temprana (menarquia prematura).  No ha tenido ningn hijo (nuliparidad).  Tiene sobrepeso u obesidad. Cules son los signos o los sntomas? Muchas mujeres no tienen sntomas. Los sntomas de esta afeccin pueden incluir los siguientes:  Hemorragias menstruales abundantes.  Prdidas de [place] o [employer] perodos [place].  Dolor y presin en la zona plvica, entre las caderas.  Problemas en la vejiga, como necesidad imperiosa de orinar u orinar con ms frecuencia que lo habitual.  Incapacidad para tener hijos (infertilidad).  Imposibilidad de llevar un embarazo a trmino (aborto espontneo). Cmo se diagnostica? Esta afeccin se puede diagnosticar en funcin de lo siguiente:  Los sntomas y antecedentes mdicos.  Un examen fsico.  Un examen plvico que incluye la palpacin para detectar tumores.  Estudios de diagnstico por imgenes, como una ecografa o una resonancia magntica (RM). Cmo se trata? El tratamiento de esta afeccin puede incluir lo siguiente:  Ver al [employer] en visitas de seguimiento para controlar si hubo cambios en  los fibromas.  Tomar antiinflamatorios no esteroideos (AINE), como ibuprofeno, naproxeno o aspirina para Best boy.  Medicamentos hormonales. Pueden tomarse en forma de comprimidos, aplicarse mediante inyecciones o administrarse a travs de un dispositivo con forma de T que se coloca en el tero (dispositivo intrauterino o DIU).  [place] para extirpar: ? Los fibromas (miomectoma). El mdico puede recomendarle esta opcin si los fibromas afectan su fertilidad y tiene deseos de [place]. ? El tero (histerectoma). ? La irrigacin sangunea a los fibromas (embolizacin de la arteria uterina). Siga estas indicaciones en su casa:  Tome los medicamentos de venta libre y los recetados solamente como se lo haya indicado el mdico.  Consulte a su mdico si debe tomar comprimidos de hierro o comer ms alimentos ricos en hierro, como verduras de hojas de color verde oscuro. El sangrado menstrual excesivo pueden causar niveles bajos de hierro.  Si se lo indican, aplique calor en la espalda o el abdomen para Best boy. Use la fuente de calor que el mdico le recomiende, como una compresa de calor hmedo o una almohadilla trmica. ? Coloque una [employer] piel y la fuente de Freight forwarder. ? Aplique el calor durante 20 a 71minutos. ? Retire la fuente de calor si la piel se pone de color rojo brillante. Esto es muy importante si no puede Education officer, environmental, calor o fro. Puede correr un riesgo mayor de sufrir quemaduras.  Preste mucha atencin a su ciclo menstrual. Informe al mdico si nota algn cambio, por ejemplo: ? Un aumento del flujo de sangre que le exige el uso de ms apsitos o tampones que los que utiliza habitualmente. ? Un cambio en el nmero de [employer] dura la [place]. ?  Un cambio en los sntomas asociados con la [place], como dolor de espalda o clicos abdominales.  Concurra a todas las visitas de seguimiento como se lo haya indicado el mdico. Esto es  importante, especialmente si los fibromas deben controlarse para Actuary cambio. Comunquese con un mdico si:  Tiene dolor plvico, dolor de espalda o clicos abdominales que no se alivian con los medicamentos o al Presenter, broadcasting.  Presenta nuevo sangrado entre los [employer].  Observa un aumento del sangrado durante y [employer] perodos [place].  Se siente inusualmente cansada o dbil.  Tiene sensacin de desvanecimiento. Solicite ayuda inmediatamente si:  Se desmaya.  Tiene un dolor plvico que empeora sbitamente.  Tiene sangrado vaginal intenso que empapa un tampn o un apsito en 30 minutos o menos. Resumen  Los fibromas uterinos son tumores no cancerosos (benignos) que pueden Therapist, art.  Se desconoce la causa exacta de esta afeccin.  La mayora de los fibromas no necesitan tratamiento mdico, a menos que afecten su capacidad para tener hijos (fertilidad).  Comunquese con el mdico si tiene dolor plvico, dolor de espalda o clicos abdominales que no se alivian con medicamentos.  Asegrese de saber por cules sntomas debera obtener ayuda de inmediato. Esta informacin no tiene Marine scientist el consejo del mdico. Asegrese de hacerle al mdico cualquier pregunta que tenga. Document Revised: 05/08/2017 Document Reviewed: 03/09/2017 Elsevier Patient Education  2020 [employer].

## 2019-12-08 ENCOUNTER — Other Ambulatory Visit: Payer: Self-pay | Admitting: Obstetrics & Gynecology

## 2019-12-08 DIAGNOSIS — N946 Dysmenorrhea, unspecified: Secondary | ICD-10-CM

## 2019-12-12 ENCOUNTER — Other Ambulatory Visit: Payer: Self-pay | Admitting: Obstetrics & Gynecology

## 2019-12-12 DIAGNOSIS — N946 Dysmenorrhea, unspecified: Secondary | ICD-10-CM

## 2019-12-16 ENCOUNTER — Other Ambulatory Visit: Payer: Self-pay | Admitting: Obstetrics & Gynecology

## 2019-12-16 DIAGNOSIS — N946 Dysmenorrhea, unspecified: Secondary | ICD-10-CM

## 2019-12-19 ENCOUNTER — Encounter: Payer: Self-pay | Admitting: Physician Assistant

## 2019-12-19 ENCOUNTER — Ambulatory Visit (INDEPENDENT_AMBULATORY_CARE_PROVIDER_SITE_OTHER): Payer: Self-pay | Admitting: Physician Assistant

## 2019-12-19 VITALS — BP 110/60 | HR 70 | Ht 59.0 in | Wt 144.0 lb

## 2019-12-19 DIAGNOSIS — R109 Unspecified abdominal pain: Secondary | ICD-10-CM

## 2019-12-19 DIAGNOSIS — R1013 Epigastric pain: Secondary | ICD-10-CM

## 2019-12-19 MED ORDER — DICYCLOMINE HCL 10 MG PO CAPS: 10.0000 mg | ORAL_CAPSULE | Freq: Three times a day (TID) | ORAL | 2 refills | Status: DC | PRN

## 2019-12-19 MED ORDER — OMEPRAZOLE 40 MG PO CPDR
40.0000 mg | DELAYED_RELEASE_CAPSULE | Freq: Every day | ORAL | 3 refills | Status: DC
Start: 2019-12-19 — End: 2020-01-25

## 2019-12-19 MED ORDER — SUCRALFATE 1 G PO TABS: ORAL_TABLET | ORAL | 2 refills | Status: DC

## 2019-12-19 NOTE — Progress Notes (Signed)
Subjective:    Patient ID: Chelsea Yates, female    DOB: 1975-12-18, 44 y.o.   MRN: [id number]  HPI Chelsea Yates is a 44 year old non-English-speaking Hispanic female, recently established ( [place]) and seen by myself on 10/05/2019.  She comes in today for follow-up.  At the time of initial visit she had complaints of right mid abdominal pain present over the past 2 years but had worsened over the previous 2 months.  Prior H. pylori IgG negative, CT of the abdomen and pelvis on 11/22/2019 unremarkable and baseline labs negative. She had also been complaining of menorrhagia and had been noted to have uterine fibroids and left ovarian cyst and was referred to GYN.  She has been seen by gynecology on 11/27/2019 noted to have dysmenorrhea and placed on ibuprofen around the time of her menstrual cycle no other work-up indicated. She was started on omeprazole 40 mg p.o. every morning here as well as Bentyl 10 mg p.o. 3 times daily as needed. She says that both of these medications help a little bit but nothing has alleviated her symptoms.  She seems to be complaining more of upper abdominal discomfort now and burning in nature.  She is also been having acid reflux.  No real change in bowel habits, no melena or hematochezia.  She says she is not taking the ibuprofen on any sort of regular basis. Patient had been hesitant to schedule procedures at the time of last visit due to her financial situation.  She says she has applied for financial assistance through [facility] health but was told it may take up to 2 months to process. No prior endoscopic evaluation.  Review of Systems Pertinent positive and negative review of systems were noted in the above HPI section.  All other review of systems was otherwise negative.  Outpatient Encounter Medications as of 12/19/2019  Medication Sig   dicyclomine (BENTYL) 10 MG capsule Take 1 capsule (10 mg total) by mouth 3 (three) times daily as needed for spasms (abdominal cramping).    ibuprofen (ADVIL) 600 MG tablet Take 1 tablet (600 mg total) by mouth every 6 (six) hours as needed.   omeprazole (PRILOSEC) 40 MG capsule Take 1 capsule (40 mg total) by mouth daily.   [DISCONTINUED] dicyclomine (BENTYL) 10 MG capsule Take 1 capsule (10 mg total) by mouth 3 (three) times daily as needed for spasms (abdominal cramping).   [DISCONTINUED] omeprazole (PRILOSEC) 40 MG capsule Take 1 capsule (40 mg total) by mouth daily.   sucralfate (CARAFATE) 1 g tablet Take 1 tablet between meals and at bedtime   No facility-administered encounter medications on file as of 12/19/2019.   No Known Allergies Patient Active Problem List   Diagnosis Date Noted   History of molar pregnancy 11/27/2019   Abdominal pain, chronic, right lower quadrant 10/05/2019   Hyperlipidemia 02/22/2019   RUQ pain 02/22/2019   Screening breast examination 01/03/2019   Social History   Socioeconomic History   Marital status: Married    Spouse name: Not on file   Number of children: 4   Years of education: Not on file   Highest education level: 6th grade  Occupational History   Occupation: Education administrator services  Tobacco Use   Smoking status: Never Smoker   Smokeless tobacco: Never Used  Scientific laboratory technician Use: Never used  Substance and Sexual Activity   Alcohol use: No   Drug use: No   Sexual activity: Yes    Birth control/protection: Condom  Other  Topics Concern   Not on file  Social History Narrative   Not on file   Social Determinants of Health   Financial Resource Strain:    Difficulty of Paying Living Expenses: Not on file  Food Insecurity:    Worried About [place] in the Last Year: Not on file   Ran Out of Food in the Last Year: Not on file  Transportation Needs: No Transportation Needs   Lack of Transportation (Medical): No   Lack of Transportation (Non-Medical): No  Physical Activity:    Days of Exercise per Week: Not on file   Minutes of  Exercise per Session: Not on file  Stress:    Feeling of Stress : Not on file  Social Connections:    Frequency of Communication with Friends and Family: Not on file   Frequency of Social Gatherings with Friends and Family: Not on file   Attends Religious Services: Not on file   Active Member of Clubs or Organizations: Not on file   Attends Archivist Meetings: Not on file   Marital Status: Not on file  Intimate Partner Violence:    Fear of Current or Ex-Partner: Not on file   Emotionally Abused: Not on file   Physically Abused: Not on file   Sexually Abused: Not on file    Ms. Linton Rump Lopez's family history includes Heart disease in her mother; Hypertension in her mother; Thyroid disease in her son; Uterine cancer in her maternal grandmother.      Objective:    Vitals:   12/19/19 1104  BP: 110/60  Pulse: 70    Physical Exam Well-developed well-nourished Hispanic female in no acute distress.  Height, Weight, 144 BMI 29.0  HEENT; nontraumatic normocephalic, EOMI, PE RR LA, sclera anicteric. Oropharynx; not examined Neck; supple, no JVD Cardiovascular; regular rate and rhythm with S1-S2, no murmur rub or gallop Pulmonary; Clear bilaterally Abdomen; soft,nondistended, she is tender across the epigastrium and upper abdomen no guarding or rebound, no palpable mass or hepatosplenomegaly, bowel sounds are active Rectal; not done Skin; benign exam, no jaundice rash or appreciable lesions Extremities; no clubbing cyanosis or edema skin warm and dry Neuro/Psych; alert and oriented x4, grossly nonfocal mood and affect flat       Assessment & Plan:   #33 44 year old non-English-speaking Hispanic female with 2-year history of right mid abdominal pain, chronic now with progression to epigastric pain and upper abdominal pain described as burning in nature over the past few months. Work-up to date unremarkable including labs and CT of the abdomen and pelvis. She  does have uterine fibroids, has been evaluated by GYN and not felt to be the etiology of her current pain.  Rule out symptoms secondary to IBS, chronic gastropathy, doubt IBD but cannot rule out, neoplasm.  #2 GERD  Plan; continue omeprazole 40 mg p.o. every morning Start trial of Carafate 1 g between meals and at bedtime Continue Bentyl 10 mg p.o. 3 times daily as needed. Discussed upper endoscopy and colonoscopy.  Patient wishes to proceed now.  She will be scheduled for colonoscopy and EGD with Dr. Carlean Purl.  Both procedures were discussed in detail with patient including indications risks benefits and she is agreeable to proceed. She has completed COVID-19 vaccination. If EGD and colonoscopy are negative, consider trial of TCA or SSRI for functional abdominal pain.  Vici Novick Genia Harold PA-C 12/19/2019   Cc: Madalyn Rob, MD

## 2019-12-19 NOTE — Patient Instructions (Addendum)
If you are age 44 or older, your body mass index should be between 23-30. Your Body mass index is 29.08 kg/m. If this is out of the aforementioned range listed, please consider follow up with your Primary Care Provider.  If you are age 66 or younger, your body mass index should be between 19-25. Your Body mass index is 29.08 kg/m. If this is out of the aformentioned range listed, please consider follow up with your Primary Care Provider.   You have been scheduled for an endoscopy and colonoscopy. Please follow the written instructions given to you at your visit today. Please pick up your prep supplies at the pharmacy within the next 1-3 days. If you use inhalers (even only as needed), please bring them with you on the day of your procedure.  START Carafate 1 gram 1 tablet between meals and at bedtime. Continue Omeprazole and Dicyclomine  Follow up pending at this time.

## 2019-12-20 ENCOUNTER — Other Ambulatory Visit: Payer: Self-pay | Admitting: Obstetrics & Gynecology

## 2019-12-20 DIAGNOSIS — N946 Dysmenorrhea, unspecified: Secondary | ICD-10-CM

## 2019-12-24 ENCOUNTER — Other Ambulatory Visit: Payer: Self-pay | Admitting: Obstetrics & Gynecology

## 2019-12-24 DIAGNOSIS — N946 Dysmenorrhea, unspecified: Secondary | ICD-10-CM

## 2019-12-25 NOTE — Progress Notes (Signed)
Addendum: Reviewed and agree with assessment and management plan. [place] Lebeau M, MD  

## 2019-12-28 ENCOUNTER — Encounter: Payer: Self-pay | Admitting: Internal Medicine

## 2019-12-28 ENCOUNTER — Ambulatory Visit (AMBULATORY_SURGERY_CENTER): Payer: Self-pay | Admitting: Internal Medicine

## 2019-12-28 ENCOUNTER — Other Ambulatory Visit: Payer: Self-pay | Admitting: Obstetrics & Gynecology

## 2019-12-28 ENCOUNTER — Other Ambulatory Visit: Payer: Self-pay

## 2019-12-28 VITALS — BP 131/67 | HR 62 | Temp 97.3°F | Resp 12 | Ht 59.0 in | Wt 144.0 lb

## 2019-12-28 DIAGNOSIS — N946 Dysmenorrhea, unspecified: Secondary | ICD-10-CM

## 2019-12-28 DIAGNOSIS — K2950 Unspecified chronic gastritis without bleeding: Secondary | ICD-10-CM

## 2019-12-28 DIAGNOSIS — G8929 Other chronic pain: Secondary | ICD-10-CM

## 2019-12-28 DIAGNOSIS — K259 Gastric ulcer, unspecified as acute or chronic, without hemorrhage or perforation: Secondary | ICD-10-CM

## 2019-12-28 DIAGNOSIS — R1031 Right lower quadrant pain: Secondary | ICD-10-CM

## 2019-12-28 DIAGNOSIS — R1013 Epigastric pain: Secondary | ICD-10-CM

## 2019-12-28 DIAGNOSIS — K2970 Gastritis, unspecified, without bleeding: Secondary | ICD-10-CM

## 2019-12-28 MED ORDER — SODIUM CHLORIDE 0.9 % IV SOLN: 500.0000 mL | INTRAVENOUS | Status: DC

## 2019-12-28 NOTE — Progress Notes (Signed)
I have reviewed the patient's medical history in detail and updated the computerized patient record.

## 2019-12-28 NOTE — Progress Notes (Signed)
Translator Chelsea Yates present in [place].

## 2019-12-28 NOTE — Op Note (Signed)
[facility] Patient Name: Chelsea Yates Procedure Date: 12/28/2019 3:18 PM MRN: [id number] Endoscopist: Gatha Mayer , MD Age: 44 Referring MD:  Date of Birth: 1975/04/12 Gender: Female Account #: [id number] Procedure:                Colonoscopy Indications:              Abdominal pain in the right lower quadrant Medicines:                Propofol per Anesthesia, Monitored Anesthesia Care Procedure:                Pre-Anesthesia Assessment:                           - Prior to the procedure, a History and Physical                            was performed, and patient medications and                            allergies were reviewed. The patient's tolerance of                            previous anesthesia was also reviewed. The risks                            and benefits of the procedure and the sedation                            options and risks were discussed with the patient.                            All questions were answered, and informed consent                            was obtained. Prior Anticoagulants: The patient has                            taken no previous anticoagulant or antiplatelet                            agents. ASA Grade Assessment: II - A patient with                            mild systemic disease. After reviewing the risks                            and benefits, the patient was deemed in                            satisfactory condition to undergo the procedure.                           After obtaining informed consent, the colonoscope  was passed under direct vision. Throughout the                            procedure, the patient's blood pressure, pulse, and                            oxygen saturations were monitored continuously. The                            Colonoscope was introduced through the anus and                            advanced to the the terminal ileum, with                             identification of the appendiceal orifice and IC                            valve. The colonoscopy was performed without                            difficulty. The patient tolerated the procedure                            well. The quality of the bowel preparation was                            excellent. The terminal ileum, ileocecal valve,                            appendiceal orifice, and rectum were photographed. Scope In: 3:39:49 PM Scope Out: 3:50:14 PM Scope Withdrawal Time: 0 hours 6 minutes 48 seconds  Total Procedure Duration: 0 hours 10 minutes 25 seconds  Findings:                 The perianal and digital rectal examinations were                            normal.                           The terminal ileum appeared normal.                           The entire examined colon appeared normal on direct                            and retroflexion views. Complications:            No immediate complications. Estimated Blood Loss:     Estimated blood loss: none. Impression:               - The examined portion of the ileum was normal.                           - The entire examined colon  is normal on direct and                            retroflexion views.                           - No specimens collected. Recommendation:           - Patient has a contact number available for                            emergencies. The signs and symptoms of potential                            delayed complications were discussed with the                            patient. Return to normal activities tomorrow.                            Written discharge instructions were provided to the                            patient.                           - Resume previous diet.                           - Continue present medications.                           - Repeat colonoscopy in 10 years for screening                            purposes. Gatha Mayer, MD 12/28/2019 4:08:05 PM This report  has been signed electronically.

## 2019-12-28 NOTE — Op Note (Signed)
[facility] Patient Name: Chelsea Yates Procedure Date: 12/28/2019 3:19 PM MRN: [id number] Endoscopist: Gatha Mayer , MD Age: 44 Referring MD:  Date of Birth: 1975-08-22 Gender: Female Account #: [id number] Procedure:                Upper GI endoscopy Indications:              Epigastric abdominal pain Medicines:                Propofol per Anesthesia, Monitored Anesthesia Care Procedure:                Pre-Anesthesia Assessment:                           - Prior to the procedure, a History and Physical                            was performed, and patient medications and                            allergies were reviewed. The patient's tolerance of                            previous anesthesia was also reviewed. The risks                            and benefits of the procedure and the sedation                            options and risks were discussed with the patient.                            All questions were answered, and informed consent                            was obtained. Prior Anticoagulants: The patient has                            taken no previous anticoagulant or antiplatelet                            agents. ASA Grade Assessment: II - A patient with                            mild systemic disease. After reviewing the risks                            and benefits, the patient was deemed in                            satisfactory condition to undergo the procedure.                           After obtaining informed consent, the endoscope was  passed under direct vision. Throughout the                            procedure, the patient's blood pressure, pulse, and                            oxygen saturations were monitored continuously. The                            Endoscope was introduced through the mouth, and                            advanced to the second part of duodenum. The upper                             GI endoscopy was accomplished without difficulty.                            The patient tolerated the procedure well. Scope In: Scope Out: Findings:                 A few dispersed diminutive erosions with no                            stigmata of recent bleeding were found in the                            gastric antrum. Biopsies were taken with a cold                            forceps for histology. Verification of patient                            identification for the specimen was done. Estimated                            blood loss was minimal.                           The gastroesophageal flap valve was visualized                            endoscopically and classified as Hill Grade III                            (minimal fold, loose to endoscope, hiatal hernia                            likely).                           The exam was otherwise without abnormality.                           The cardia and gastric fundus were normal on  retroflexion. Complications:            No immediate complications. Estimated Blood Loss:     Estimated blood loss was minimal. Impression:               - Erosive gastropathy with no stigmata of recent                            bleeding. Biopsied.                           - Gastroesophageal flap valve classified as Hill                            Grade III (minimal fold, loose to endoscope, hiatal                            hernia likely).                           - The examination was otherwise normal. Recommendation:           - Patient has a contact number available for                            emergencies. The signs and symptoms of potential                            delayed complications were discussed with the                            patient. Return to normal activities tomorrow.                            Written discharge instructions were provided to the                            patient.                            - Resume previous diet.                           - Continue present medications.                           - Await pathology results.                           - See the other procedure note for documentation of                            additional recommendations.                           - Arrange December or Janusry follow-up with Amy  Ewsterwood, PA-C to review results and change                            treatment Gatha Mayer, MD 12/28/2019 4:05:02 PM This report has been signed electronically.

## 2019-12-28 NOTE — Patient Instructions (Addendum)
There was some inflammation in the stomach - biopsies taken. We will contact you with results.  The colonoscopy was normal.  We will arrange for you to have a follow-up visit also to review results and make treatment changes.  I appreciate the opportunity to care for you. Gatha Mayer, MD, [facility]  Resume previous diet. Continue present medications. Await pathology results.  Arrange follow-up with Nicoletta Ba PA to review results and change treatment.  Repeat colonoscopy in 10 years for screening purposes.  YOU HAD AN ENDOSCOPIC PROCEDURE TODAY AT [facility]:   Refer to the procedure report that was given to you for any specific questions about what was found during the examination.  If the procedure report does not answer your questions, please call your gastroenterologist to clarify.  If you requested that your care partner not be given the details of your procedure findings, then the procedure report has been included in a sealed envelope for you to review at your convenience later.  YOU SHOULD EXPECT: Some feelings of bloating in the abdomen. Passage of more gas than usual.  Walking can help get rid of the air that was put into your GI tract during the procedure and reduce the bloating. If you had a lower endoscopy (such as a colonoscopy or flexible sigmoidoscopy) you may notice spotting of blood in your stool or on the toilet paper. If you underwent a bowel prep for your procedure, you may not have a normal bowel movement for a few days.  Please Note:  You might notice some irritation and congestion in your nose or some drainage.  This is from the oxygen used during your procedure.  There is no need for concern and it should clear up in a day or so.  SYMPTOMS TO REPORT IMMEDIATELY:   Following lower endoscopy (colonoscopy or flexible sigmoidoscopy):  Excessive amounts of blood in the stool  Significant tenderness or worsening of abdominal pains  Swelling of the  abdomen that is new, acute  Fever of 100F or higher   Following upper endoscopy (EGD)  Vomiting of blood or coffee ground material  New chest pain or pain under the shoulder blades  Painful or persistently difficult swallowing  New shortness of breath  Fever of 100F or higher  Black, tarry-looking stools  For urgent or emergent issues, a gastroenterologist can be reached at any hour by calling [phone]. Do not use MyChart messaging for urgent concerns.    DIET:  We do recommend a small meal at first, but then you may proceed to your regular diet.  Drink plenty of fluids but you should avoid alcoholic beverages for 24 hours.  ACTIVITY:  You should plan to take it easy for the rest of today and you should NOT DRIVE or use heavy machinery until tomorrow (because of the sedation medicines used during the test).    FOLLOW UP: Our staff will call the number listed on your records 48-72 hours following your procedure to check on you and address any questions or concerns that you may have regarding the information given to you following your procedure. If we do not reach you, we will leave a message.  We will attempt to reach you two times.  During this call, we will ask if you have developed any symptoms of COVID 19. If you develop any symptoms (ie: fever, flu-like symptoms, shortness of breath, cough etc.) before then, please call [phone].  If you test positive for Covid 19 in  the 2 weeks post procedure, please call and report this information to [place].    If any biopsies were taken you will be contacted by phone or by letter within the next 1-3 weeks.  Please call us at [phone] if you have not heard about the biopsies in 3 weeks.    SIGNATURES/CONFIDENTIALITY: You and/or your care partner have signed paperwork which will be entered into your electronic medical record.  These signatures attest to the fact that that the information above on your After Visit Summary has been  reviewed and is understood.  Full responsibility of the confidentiality of this discharge information lies with you and/or your care-partner.  USTED TUVO UN PROCEDIMIENTO ENDOSCPICO HOY EN EL [facility]:   Lea el informe del procedimiento que se le entreg para cualquier pregunta especfica sobre lo que se Primary school teacher.  Si el informe del examen no responde a sus preguntas, por favor llame a su gastroenterlogo para aclararlo.  Si usted solicit que no se le den [employer] de lo que se Estate manager/land agent en su procedimiento al [employer] va a cuidar, entonces el informe del procedimiento se ha incluido en un sobre sellado para que usted lo revise despus cuando le sea ms conveniente.   LO QUE PUEDE ESPERAR: Algunas sensaciones de hinchazn en el abdomen.  Puede tener ms gases de lo normal.  El caminar puede ayudarle a eliminar el aire que se le puso en el tracto gastrointestinal durante el procedimiento y reducir la hinchazn.  Si le hicieron una endoscopia inferior (como una colonoscopia o una sigmoidoscopia flexible), podra notar manchas de sangre en las heces fecales o en el papel higinico.  Si se someti a una preparacin intestinal para su procedimiento, es posible que no tenga una evacuacin intestinal normal durante [employer].   Tenga en cuenta:  Es posible que note un poco de irritacin y congestin en la nariz o algn drenaje.  Esto es debido al oxgeno [employer] durante su procedimiento.  No hay que preocuparse y esto debe desaparecer ms o Scientist, research (medical).   SNTOMAS PARA REPORTAR INMEDIATAMENTE:  Despus de una endoscopia inferior (colonoscopia o sigmoidoscopia flexible):  Cantidades excesivas de sangre en las heces fecales  Sensibilidad significativa o empeoramiento de los dolores abdominales   Hinchazn aguda del abdomen que antes no tena   Fiebre de 100F o ms   Despus de la endoscopia superior (EGD)  Vmitos de Biochemist, clinical o material como caf molido    Dolor en el pecho o dolor debajo de los omplatos que antes no tena   Dolor o dificultad persistente para tragar  Falta de aire que antes no tena   Fiebre de 100F o ms  Heces fecales negras y pegajosas   Para asuntos urgentes o de Freight forwarder, puede comunicarse con un gastroenterlogo a cualquier hora llamando al [phone].  DIETA:  Recomendamos una comida pequea al principio, pero luego puede continuar con su dieta normal.  Tome muchos lquidos, Teacher, adult education las bebidas alcohlicas durante 24 horas.    ACTIVIDAD:  Debe planear tomarse las cosas con calma por el resto del da y no debe CONDUCIR ni usar maquinaria pesada Programmer, applications (debido a los medicamentos de sedacin utilizados durante el examen).     SEGUIMIENTO: Nuestro personal llamar al nmero que aparece en su historial al siguiente da hbil de su procedimiento para ver cmo se siente y para responder cualquier pregunta o inquietud que pueda tener con [place] a  la informacin que se le dio despus del procedimiento. Si no podemos contactarle, le dejaremos un mensaje.  Sin embargo, si se siente bien y no tiene Paediatric nurse, no es necesario que nos devuelva la llamada.  Asumiremos que ha regresado a sus actividades diarias normales sin incidentes. Si se le tomaron algunas biopsias, le contactaremos por telfono o por carta en las prximas 3 semanas.  Si no ha sabido [employer] biopsias en el transcurso de 3 semanas, por favor llmenos al [phone].   FIRMAS/CONFIDENCIALIDAD: Usted y/o el acompaante que le cuide han firmado documentos que se ingresarn en su historial mdico electrnico.  Estas firmas atestiguan el hecho de que la informacin anterior

## 2019-12-28 NOTE — Progress Notes (Signed)
Called to room to assist during endoscopic procedure.  Patient ID and intended procedure confirmed with present staff. Received instructions for my participation in the procedure from the performing physician.  

## 2020-01-01 ENCOUNTER — Other Ambulatory Visit: Payer: Self-pay | Admitting: Obstetrics & Gynecology

## 2020-01-01 ENCOUNTER — Telehealth: Payer: Self-pay

## 2020-01-01 DIAGNOSIS — N946 Dysmenorrhea, unspecified: Secondary | ICD-10-CM

## 2020-01-01 NOTE — Telephone Encounter (Signed)
°  Follow up Call-  Call back number 12/28/2019  Post procedure Call Back phone  # [phone]  Permission to leave phone message Yes  Some recent data might be hidden     Patient questions:  Do you have a fever, pain , or abdominal swelling? No. Pain Score  0 *  Have you tolerated food without any problems? Yes.    Have you been able to return to your normal activities? Yes.    Do you have any questions about your discharge instructions: Diet   No. Medications  No. Follow up visit  No.  Do you have questions or concerns about your Care? No.  Actions: * If pain score is 4 or above: No action needed, pain <4.  1. Have you developed a fever since your procedure? no  2.   Have you had an respiratory symptoms (SOB or cough) since your procedure? no  3.   Have you tested positive for COVID 19 since your procedure no  4.   Have you had any family members/close contacts diagnosed with the COVID 19 since your procedure?  no   If yes to any of these questions please route to Joylene John, RN and Joella Prince, RN

## 2020-01-03 ENCOUNTER — Telehealth: Payer: Self-pay | Admitting: Internal Medicine

## 2020-01-03 NOTE — Telephone Encounter (Signed)
Spoke with pt via interpreter [phone] with service. Explained that sometimes the position they are in for the procedures can cause some discomfort. Pt advised to try tylenol for the discomfort.If pain is not controlled or becomes severe pt advised to proceed to urgent care or ER for eval.

## 2020-01-03 NOTE — Telephone Encounter (Signed)
Pt states she had an EGD/Colon done on 11/18, pt states she was to inform the nurse if she started experiencing shoulder pain which started yesterday morning. Pt would like some advice on what to do.  Pt is spanish speaking.

## 2020-01-05 ENCOUNTER — Other Ambulatory Visit: Payer: Self-pay | Admitting: Obstetrics & Gynecology

## 2020-01-05 DIAGNOSIS — N946 Dysmenorrhea, unspecified: Secondary | ICD-10-CM

## 2020-01-09 ENCOUNTER — Other Ambulatory Visit: Payer: Self-pay | Admitting: Obstetrics & Gynecology

## 2020-01-09 DIAGNOSIS — N946 Dysmenorrhea, unspecified: Secondary | ICD-10-CM

## 2020-01-10 ENCOUNTER — Encounter: Payer: Self-pay | Admitting: Internal Medicine

## 2020-01-11 ENCOUNTER — Other Ambulatory Visit: Payer: Self-pay

## 2020-01-11 ENCOUNTER — Ambulatory Visit: Payer: Self-pay | Admitting: *Deleted

## 2020-01-11 ENCOUNTER — Ambulatory Visit
Admission: RE | Admit: 2020-01-11 | Discharge: 2020-01-11 | Disposition: A | Discharge status: Home / Self Care | Payer: No Typology Code available for payment source | Source: Ambulatory Visit | Attending: Obstetrics and Gynecology | Admitting: Obstetrics and Gynecology

## 2020-01-11 VITALS — BP 118/88 | Wt 142.0 lb

## 2020-01-11 DIAGNOSIS — Z1239 Encounter for other screening for malignant neoplasm of breast: Secondary | ICD-10-CM

## 2020-01-11 DIAGNOSIS — Z1231 Encounter for screening mammogram for malignant neoplasm of breast: Secondary | ICD-10-CM

## 2020-01-11 NOTE — Patient Instructions (Addendum)
Explained breast self awareness with [name]. Patient did not need a Pap smear today due to last Pap smear was 04/04/2018. Let her know BCCCP will cover Pap smears every 3 years unless has a history of abnormal Pap smears. Referred patient to the [facility] for a screening mammogram on the mobile unit. Appointment scheduled Thursday, January 11, 2020 at 1000. Patient escorted to the mobile unit following her [facility] appointment for her screening mammogram. Let patient know the [facility] Center will follow up with her within the next couple weeks with results of her mammogram by letter or phone. Chelsea Yates verbalized understanding.  Jahliyah Trice, Arvil Chaco, RN 9:41 AM

## 2020-01-11 NOTE — Progress Notes (Signed)
[name] is a 44 y.o. female who presents to [facility] clinic today with no complaints.    Pap Smear: Pap smear not completed today. Last Pap smear was 04/04/2018 at the free cervical cancer screening at the [facility] and normal. Per patient has no history of an abnormal Pap smear. Last Pap smear result is available in Epic.   Physical exam: Breasts Left breast slightly larger than right breast. Per patient no changes observed.No skin abnormalities bilateral breasts. No nipple retraction bilateral breasts. No nipple discharge bilateral breasts. No lymphadenopathy. No lumps palpated bilateral breasts. No complaints of pain or tenderness on exam.    Pelvic/Bimanual Pap is not indicated today per BCCCP guidelines.   Smoking History: Patient has never smoked.   Patient Navigation: Patient education provided. Access to services provided for patient through [facility] program. Spanish interpreter Rudene Anda from [facility] provided.  Breast and Cervical Cancer Risk Assessment: Patient does not have family history of breast cancer, known genetic mutations, or radiation treatment to the chest before age 30. Patient does not have history of cervical dysplasia, immunocompromised, or DES exposure in-utero.  Risk Assessment    Risk Scores      01/11/2020 01/03/2019   Last edited by: Royston Bake, CMA Tyrease Vandeberg, Heath Gold, RN   5-year risk: 0.4 % 0.4 %   Lifetime risk: 5.6 % 5.7 %          A: BCCCP exam without pap smear No complaints.  P: Referred patient to the [facility] for a screening mammogram on the mobile unit. Appointment scheduled Thursday, January 11, 2020 at 1000.  Loletta Parish, RN 01/11/2020 9:40 AM

## 2020-01-13 ENCOUNTER — Other Ambulatory Visit: Payer: Self-pay | Admitting: Obstetrics & Gynecology

## 2020-01-13 DIAGNOSIS — N946 Dysmenorrhea, unspecified: Secondary | ICD-10-CM

## 2020-01-14 ENCOUNTER — Ambulatory Visit: Payer: Self-pay | Admitting: Physician Assistant

## 2020-01-17 ENCOUNTER — Other Ambulatory Visit: Payer: Self-pay | Admitting: Obstetrics & Gynecology

## 2020-01-17 DIAGNOSIS — N946 Dysmenorrhea, unspecified: Secondary | ICD-10-CM

## 2020-01-21 ENCOUNTER — Other Ambulatory Visit: Payer: Self-pay | Admitting: Obstetrics & Gynecology

## 2020-01-21 DIAGNOSIS — N946 Dysmenorrhea, unspecified: Secondary | ICD-10-CM

## 2020-01-25 ENCOUNTER — Telehealth: Payer: Self-pay

## 2020-01-25 ENCOUNTER — Other Ambulatory Visit: Payer: Self-pay | Admitting: Obstetrics & Gynecology

## 2020-01-25 ENCOUNTER — Encounter: Payer: Self-pay | Admitting: Physician Assistant

## 2020-01-25 ENCOUNTER — Ambulatory Visit (INDEPENDENT_AMBULATORY_CARE_PROVIDER_SITE_OTHER): Payer: Self-pay | Admitting: Physician Assistant

## 2020-01-25 VITALS — BP 130/80 | HR 65 | Ht 59.0 in | Wt 142.0 lb

## 2020-01-25 DIAGNOSIS — K2970 Gastritis, unspecified, without bleeding: Secondary | ICD-10-CM

## 2020-01-25 DIAGNOSIS — N946 Dysmenorrhea, unspecified: Secondary | ICD-10-CM

## 2020-01-25 DIAGNOSIS — R109 Unspecified abdominal pain: Secondary | ICD-10-CM

## 2020-01-25 DIAGNOSIS — K589 Irritable bowel syndrome without diarrhea: Secondary | ICD-10-CM

## 2020-01-25 MED ORDER — DICYCLOMINE HCL 10 MG PO CAPS: 10.0000 mg | ORAL_CAPSULE | Freq: Three times a day (TID) | ORAL | 11 refills | Status: DC | PRN

## 2020-01-25 MED ORDER — OMEPRAZOLE 40 MG PO CPDR: 40.0000 mg | DELAYED_RELEASE_CAPSULE | Freq: Every day | ORAL | 11 refills | Status: DC

## 2020-01-25 NOTE — Telephone Encounter (Signed)
Multiple refill requests received for ibuprofen via fax. Per chart review, pt is following with GI for abdominal pain. Roselie Awkward, MD has recommended pt take ibuprofen at onset of menstrual period. Per GI note on 12/19/19 GI is aware of ibuprofen use. Notified pharmacy via fax previously that pt should follow up with OB/GYN or GI regarding symptoms and need for additional medication. Called [facility] to verify this will be taken off automatic refill.

## 2020-01-25 NOTE — Patient Instructions (Signed)
If you are age 44 or older, your body mass index should be between 23-30. Your Body mass index is 28.68 kg/m. If this is out of the aforementioned range listed, please consider follow up with your Primary Care Provider.  If you are age 76 or younger, your body mass index should be between 19-25. Your Body mass index is 28.68 kg/m. If this is out of the aformentioned range listed, please consider follow up with your Primary Care Provider.   We have sent refills of the following medications top your pharmacy: Omeprazole 40 mg 1 capsule every morning before breakfast Dicyclomine 10 mg 1 tablet three times daily as needed for abdominal pain/cramping.  STOP the sucralfate.  Follow up in 6 months with Dr. Carlean Purl or Nicoletta Ba, PA-C.  Thank you for entrusting me with your care and choosing [facility].  Amy Esterwood, PA-C

## 2020-01-25 NOTE — Progress Notes (Signed)
Subjective:    Patient ID: Chelsea Yates, female    DOB: 1976-01-20, 44 y.o.   MRN: [id number]  HPI  Chelsea Yates is a pleasant 44 year old non-English-speaking Hispanic female, recently seen by myself and established with Dr. Carlean Purl.  She comes in today for follow-up. She was initially seen on 12/19/2019 with a 2-year history of right-sided abdominal pain which had migrated more towards the epigastrium and upper Abdomen.  She had described this as a burning type pain. She was started on omeprazole and given a trial of Bentyl.  She was scheduled to undergo EGD and colonoscopy with Dr. Carlean Purl.  She had had negative CT of the abdomen and pelvis in August 2021 with exception of 2.7 cm left ovarian cyst which was felt likely physiologic. Her colonoscopy was normal and at EGD she had a few gastric erosions and otherwise negative exam.  Biopsy showed mild gastritis no evidence of H. pylori. She has continued on omeprazole and Bentyl which she has been taking 3 times daily as needed.  She was also started on a course of Carafate after the EGD but says she feels like that medicine is hurting her.  She says since starting that medicine she has a odd sensation on her skin. Appetite has been okay, weight has been stable, bowel movements have been regular.  She is not having as significant epigastric discomfort but says it is still present.  Review of Systems Pertinent positive and negative review of systems were noted in the above HPI section.  All other review of systems was otherwise negative.  Outpatient Encounter Medications as of 01/25/2020  Medication Sig   ibuprofen (ADVIL) 600 MG tablet Take 1 tablet (600 mg total) by mouth every 6 (six) hours as needed.   [DISCONTINUED] dicyclomine (BENTYL) 10 MG capsule Take 1 capsule (10 mg total) by mouth 3 (three) times daily as needed for spasms (abdominal cramping).   [DISCONTINUED] omeprazole (PRILOSEC) 40 MG capsule Take 1 capsule (40 mg total) by mouth  daily.   [DISCONTINUED] sucralfate (CARAFATE) 1 g tablet Take 1 tablet between meals and at bedtime   dicyclomine (BENTYL) 10 MG capsule Take 1 capsule (10 mg total) by mouth 3 (three) times daily as needed for spasms (abdominal cramping).   omeprazole (PRILOSEC) 40 MG capsule Take 1 capsule (40 mg total) by mouth daily.   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   Abdominal pain, chronic, right lower quadrant 10/05/2019   Hyperlipidemia 02/22/2019   RUQ pain 02/22/2019   Screening breast examination 01/03/2019   Social History   Socioeconomic History   Marital status: Married    Spouse name: Not on file   Number of children: 4   Years of education: Not on file   Highest education level: 6th grade  Occupational History   Occupation: Education administrator services  Tobacco Use   Smoking status: Never Smoker   Smokeless tobacco: Never Used  Scientific laboratory technician Use: Never used  Substance and Sexual Activity   Alcohol use: No   Drug use: No   Sexual activity: Yes    Birth control/protection: Condom  Other Topics Concern   Not on file  Social History Narrative   Not on file   Social Determinants of Health   Financial Resource Strain: Not on file  Food Insecurity:  Not on file  Transportation Needs: No Transportation Needs   Lack of Transportation (Medical): No   Lack of Transportation (Non-Medical): No  Physical Activity: Not on file  Stress: Not on file  Social Connections: Not on file  Intimate Partner Violence: Not on file    Chelsea Yates's family history includes Heart disease in her mother; Hypertension in her mother; Stomach cancer in her maternal uncle; Thyroid disease in her son; Uterine cancer in her maternal grandmother.      Objective:    Vitals:   01/25/20  0829  BP: 130/80  Pulse: 65  SpO2: 99%    Physical Exam Well-developed well-nourished  hispanic female   in no acute distress.  Height, FIEPPI,951  BMI28  Interpreter present  HEENT; nontraumatic normocephalic, EOMI, PE R LA, sclera anicteric.  Extremities; no clubbing cyanosis or edema skin warm and dry Neuro/Psych; alert and oriented x4, grossly nonfocal mood and affect appropriate       Assessment & Plan:   #44 44 year old non-English-speaking Hispanic female in for follow-up today after recent evaluation for complaints of right and epigastric abdominal pain. Symptoms have improved on omeprazole 40 mg p.o. daily and Bentyl 10 mg 3 times daily as needed. CT abdomen pelvis negative for GI etiologies August 2021 Recent colonoscopy normal Recent EGD with scattered gastric erosions and biopsies consistent with mild gastritis, no H. Pylori.  Plan; continue omeprazole 40 mg p.o. every morning refill sent. Patient had been taking some ibuprofen for pain and was advised to stop that altogether and use plain Tylenol only if needed. Stop Carafate Continue Bentyl 10 mg p.o. 3 times daily as needed. We will plan office follow-up in 6 months with Dr. Carlean Purl or myself and happy to see in the interim for problems.  Chandlar Guice S Kaityln Kallstrom PA-C 01/25/2020   Cc: No ref. provider found

## 2020-03-11 ENCOUNTER — Other Ambulatory Visit: Payer: Self-pay

## 2020-03-11 ENCOUNTER — Inpatient Hospital Stay: Payer: Self-pay | Attending: Obstetrics and Gynecology | Admitting: *Deleted

## 2020-03-11 VITALS — BP 120/78 | Ht 59.0 in | Wt 142.8 lb

## 2020-03-11 DIAGNOSIS — Z0000 Encounter for general adult medical examination without abnormal findings: Secondary | ICD-10-CM

## 2020-03-11 NOTE — Progress Notes (Signed)
Wisewoman initial Baker, Erling Cruz   Clinical Measurement:  Vitals:   03/11/20 0944  BP: 118/76   Fasting Labs Drawn Today, will review with patient when they result.   Medical History:  Patient states that she has high cholesterol, does not have high blood pressure and she does not have diabetes.  Medications:  Patient states that she does not take medication to lower cholesterol, blood pressure and blood sugar.  Patient does not take an aspirin a day to help prevent a heart attack or stroke.   Blood pressure, self measurement: Patient states that she does not measure blood pressure from home. She checks her blood pressure N/A. She shares her readings with a health care provider: N/A.   Nutrition: Patient states that on average she eats 2 cups of fruit and 2 cups of vegetables per day. Patient states that she does not eat fish at least 2 times per week. Patient eats about half servings of whole grains. Patient drinks less than 36 ounces of beverages with added sugar weekly: yes. Patient is currently watching sodium or salt intake: yes. In the past 7 days patient has had 0 drinks containing alcohol. On average patient drinks 0 drinks containing alcohol per day.      Physical activity:  Patient states that she gets 210 minutes of moderate and 0 minutes of vigorous physical activity each week.  Smoking status:  Patient states that she has has never smoked .   Quality of life:  Over the past 2 weeks patient states that she had little interest or pleasure in doing things: not at all. She has been feeling down, depressed or hopeless:not at all.    Risk reduction and counseling:    Spoke with patient about daily recommendations for fruits and vegetables (2 cups of fruit and 3 cups of vegetables). Spoke about the importance of heart healthy diet which includes heart healthy fish (salmon, tuna, mackerel, sea bass or sardines) as well as whole grains (oatmeal, brown  rice, whole wheat bread and pasta and whole grain cereals). Patient is currently walking for 30 minutes a day. Encouraged her to continue with daily exercise.    Navigation:  I will notify patient of lab results.  Patient is aware of 2 more health coaching sessions and a follow up.  Time: 20 minutes

## 2020-03-12 LAB — HEMOGLOBIN A1C
Est. average glucose Bld gHb Est-mCnc: 100 mg/dL
Hgb A1c MFr Bld: 5.1 % (ref 4.8–5.6)

## 2020-03-12 LAB — LIPID PANEL
Chol/HDL Ratio: 4.3 ratio (ref 0.0–4.4)
Cholesterol, Total: 272 mg/dL — ABNORMAL HIGH (ref 100–199)
HDL: 63 mg/dL (ref >39)
LDL Chol Calc (NIH): 189 mg/dL — ABNORMAL HIGH (ref 0–99)
Triglycerides: 113 mg/dL (ref 0–149)
VLDL Cholesterol Cal: 20 mg/dL (ref 5–40)

## 2020-03-12 LAB — GLUCOSE, RANDOM: Glucose: 95 mg/dL (ref 65–99)

## 2020-03-20 ENCOUNTER — Telehealth: Payer: Self-pay

## 2020-03-20 NOTE — Telephone Encounter (Signed)
Health coaching 2   interpreter- Chelsea Yates, [place] cholesterol, 189 LDL cholesterol, 113 triglycerides, 63 HDL cholesterol, 5.1 hemoglobin A1C, 95 mean plasma glucose. Patient understands and is aware of her lab results.   Goals-  1. Reduce the amount of fried and fatty food consumed. Try to grill, bake, broil or sautee food instead. 2. Reduce the amount of red meat consumed.  3. Continue trying to consume heart healthy fish weekly. With a goal of 2 servings per week. 4. Continue trying to consume whole grains. (brown rice, oatmeal, whole wheat bread or pasta and whole grain cereals. 5. Continue with daily exercise for at least 20 minutes per day.    Navigation:  Patient is aware of 1 more health coaching sessions and a follow up. Patient is scheduled for follow-up with Internal Medicine on Friday, March 29, 2020 @ 9:15 am for follow-up for elevated cholesterol.   Time- 10 minutes

## 2020-03-29 ENCOUNTER — Ambulatory Visit (INDEPENDENT_AMBULATORY_CARE_PROVIDER_SITE_OTHER): Payer: Self-pay | Admitting: Student

## 2020-03-29 ENCOUNTER — Encounter: Payer: Self-pay | Admitting: Student

## 2020-03-29 VITALS — BP 117/58 | HR 94 | Temp 98.2°F | Ht 59.0 in | Wt 145.0 lb

## 2020-03-29 DIAGNOSIS — E785 Hyperlipidemia, unspecified: Secondary | ICD-10-CM

## 2020-03-29 MED ORDER — ROSUVASTATIN CALCIUM 20 MG PO TABS: 20.0000 mg | ORAL_TABLET | Freq: Every day | ORAL | 11 refills | Status: DC

## 2020-03-29 NOTE — Progress Notes (Signed)
   CC: HLD follow-up  HPI:  [name] is a 45 y.o. female with past medical history significant for HLD and GERD who presents to clinic as referral from [facility] for evaluation of HLD. Refer to problem list for charting of this encounter.  Past Medical History:  Diagnosis Date  . Blood transfusion without reported diagnosis   . Fibroids   . GERD (gastroesophageal reflux disease)    no meds  . Hyperlipidemia   . Postpartum care following vaginal delivery 09/30/2010   Review of Systems:  Endorses abdominal discomfort, reflux, headaches. Denies shortness of breath or chest pain with exertion.  Physical Exam:  Vitals:   03/29/20 0929  BP: (!) 117/58  Pulse: 94  Temp: 98.2 F (36.8 C)  TempSrc: Oral  SpO2: 100%  Weight: 145 lb (65.8 kg)  Height: 4\' 11"  (1.499 m)   Physical Exam Vitals reviewed.  Constitutional:      General: She is not in acute distress.    Appearance: Normal appearance. She is not ill-appearing.  Cardiovascular:     Rate and Rhythm: Normal rate and regular rhythm.     Pulses: Normal pulses.     Heart sounds: Normal heart sounds.  Pulmonary:     Effort: Pulmonary effort is normal.     Breath sounds: Normal breath sounds.  Abdominal:     General: Abdomen is flat. Bowel sounds are normal.     Palpations: Abdomen is soft.     Tenderness: There is no abdominal tenderness.  Skin:    General: Skin is warm and dry.     Capillary Refill: Capillary refill takes less than 2 seconds.  Neurological:     General: No focal deficit present.     Mental Status: She is alert and oriented to person, place, and time.  Psychiatric:        Mood and Affect: Mood normal.        Behavior: Behavior normal.      Assessment & Plan:   See Encounters Tab for problem based charting.  Patient discussed with Dr. Dareen Piano

## 2020-03-29 NOTE — Assessment & Plan Note (Signed)
Patient presents to clinic as a referral from [facility] for evaluation of hyperlipidemia. Patient's recent lipid profile revealed total cholesterol of 272, LDL of 189 and HDL of 63. Patient does not have history of hypertension or diabetes. She reports that she walks approximately 20-minutes three times a week with her children. She does not consume much fried, fatty foods, red meat or alcohol. She reports that she consumes fruits and vegetables three times daily. She is interested in increasing her activity duration, frequency and intensity. Given her LDL being ~190, she would benefit from lipid-lowering therapy despite her ASCVD risk calculator. -Start rosuvastatin 20mg  daily  -Adjust dose as tolerated  -Repeat lipid profile in 4 months -Increase exercise duration, frequency and intensity -Continue dietary modifications

## 2020-03-29 NOTE — Patient Instructions (Signed)
Chelsea Yates,  Fue un placer conocerte en la clnica hoy.  Para su colesterol alto (hiperlipidemia): Le recomendamos que empiece a tomar un medicamento llamado rosuvastatina (o Crestor) para ayudar a reducir su colesterol. Este medicamento debe tomarse todos [place]. Volveremos a Chartered loss adjuster en 3 a 6 meses para determinar si su nivel de colesterol ha mejorado y determinar si necesitamos o no cambiar la dosis del medicamento. Mientras tanto, contine trabajando para aumentar la frecuencia, la duracin y la intensidad de sus caminatas. Contine limitando los alimentos fritos, grasos y las carnes rojas. Contine aumentando la cantidad de pescado, frutas y verduras que consume diariamente.  Si tiene alguna pregunta o inquietud, no dude en ponerse en contacto con nuestra clnica.  Atentamente, Dr. Paulla Dolly, MD  Translation provided by [employer] Translate  Chelsea Yates,  It was a pleasure meeting you in clinic today.  For your high cholesterol (hyperlipidemia): We recommend that you start a medication called rosuvastatin (or Crestor) to help lower your cholesterol. This medication should be taken every day. We will recheck your cholesterol in 3-6 months to determine if your cholesterol level has improved and determine whether or not we need to change the dose of the medication. In the meantime, continue to work on increasing the frequency, length and intensity of your walks. Continue to limit fried, fatty foods and red meats. Continue to increase the amount of fish, fruits and vegetables that you consume daily.  If you have any questions or concerns, please don't hesitate to contact our clinic.  Sincerely, Dr. Paulla Dolly, MD

## 2020-04-01 NOTE — Progress Notes (Signed)
Internal Medicine Clinic Attending  Case discussed with Dr. Johnson  At the time of the visit.  We reviewed the resident's history and exam and pertinent patient test results.  I agree with the assessment, diagnosis, and plan of care documented in the resident's note.  

## 2020-04-01 NOTE — Addendum Note (Signed)
Addended by: Aldine Contes on: 04/01/2020 10:58 AM   Modules accepted: Level of Service

## 2020-04-30 ENCOUNTER — Ambulatory Visit: Payer: No Typology Code available for payment source

## 2020-07-10 ENCOUNTER — Encounter: Payer: Self-pay | Admitting: Student

## 2020-07-10 ENCOUNTER — Ambulatory Visit (INDEPENDENT_AMBULATORY_CARE_PROVIDER_SITE_OTHER): Payer: Self-pay | Admitting: Student

## 2020-07-10 ENCOUNTER — Other Ambulatory Visit: Payer: Self-pay

## 2020-07-10 VITALS — BP 116/63 | HR 70 | Temp 98.5°F | Ht 59.0 in | Wt 144.6 lb

## 2020-07-10 DIAGNOSIS — S46819A Strain of other muscles, fascia and tendons at shoulder and upper arm level, unspecified arm, initial encounter: Secondary | ICD-10-CM

## 2020-07-10 DIAGNOSIS — E7841 Elevated Lipoprotein(a): Secondary | ICD-10-CM

## 2020-07-10 DIAGNOSIS — H6983 Other specified disorders of Eustachian tube, bilateral: Secondary | ICD-10-CM | POA: Insufficient documentation

## 2020-07-10 DIAGNOSIS — Z0000 Encounter for general adult medical examination without abnormal findings: Secondary | ICD-10-CM | POA: Insufficient documentation

## 2020-07-10 HISTORY — DX: Strain of other muscles, fascia and tendons at shoulder and upper arm level, unspecified arm, initial encounter: S46.819A

## 2020-07-10 MED ORDER — ACETAMINOPHEN 325 MG PO TABS: 650.0000 mg | ORAL_TABLET | ORAL | 0 refills | Status: AC | PRN

## 2020-07-10 MED ORDER — BIOFREEZE 4 % EX GEL: 1.0000 "application " | CUTANEOUS | 0 refills | Status: DC | PRN

## 2020-07-10 NOTE — Assessment & Plan Note (Signed)
Patient presents with approximately one month history of discomfort of the bilateral trapezius muscles. 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 She states that acetaminophen improves the pain. She has not attempted to use a heat pack, massage or topical medications. On physical examination, patient has tenderness to palpation of the trapezius muscles. Patient's symptoms likely secondary to muscle strain from her job. Advised patient to apply heat, have a family member perform massage of the area, consider trial of BioFreeze, and continue Tylenol when pain is unimproved. Statin myopathy was considered, although less likely at this time.

## 2020-07-10 NOTE — Assessment & Plan Note (Signed)
Deferred hepatitis C and HIV screening due to financial barriers as patient is currently self-pay.

## 2020-07-10 NOTE — Progress Notes (Signed)
   CC: 89-month follow-up, hyperlipidemia  HPI:  Chelsea Yates is a 45 y.o. with past medical history significant for hyperlipidemia who presents to clinic for follow-up. Refer to problem list for charting of this encounter.   Past Medical History:  Diagnosis Date  . Blood transfusion without reported diagnosis   . Fibroids   . GERD (gastroesophageal reflux disease)    no meds  . Hyperlipidemia   . Postpartum care following vaginal delivery 09/30/2010   Review of Systems:  Endorses ear pain, trapezius tightness. Denies trauma to ear, hearing loss, otorrhea, vertigo.  Physical Exam:  Vitals:   07/10/20 1324  BP: 116/63  Pulse: 70  Temp: 98.5 F (36.9 C)  TempSrc: Oral  SpO2: 100%  Weight: 144 lb 9.6 oz (65.6 kg)  Height: 4\' 11"  (1.499 m)   Physical Exam Vitals reviewed.  Constitutional:      General: She is not in acute distress. HENT:     Head: Normocephalic and atraumatic.     Right Ear: Ear canal and external ear normal. There is no impacted cerumen.     Left Ear: Ear canal and external ear normal. There is no impacted cerumen.     Ears:     Comments: Tympanic membrane clear with no effusion Cardiovascular:     Rate and Rhythm: Normal rate and regular rhythm.     Pulses: Normal pulses.     Heart sounds: Normal heart sounds.  Pulmonary:     Effort: Pulmonary effort is normal.     Breath sounds: Normal breath sounds.  Abdominal:     General: Abdomen is flat. Bowel sounds are normal.     Palpations: Abdomen is soft.     Tenderness: There is no abdominal tenderness.  Musculoskeletal:     Cervical back: Normal range of motion and neck supple.     Comments: Tenderness of trapezius muscles bilaterally  Neurological:     General: No focal deficit present.     Mental Status: She is alert. Mental status is at baseline.  Psychiatric:        Mood and Affect: Mood normal.        Behavior: Behavior normal.     Assessment & Plan:   See Encounters Tab for  problem based charting.  Patient discussed with Dr. Jimmye Norman

## 2020-07-10 NOTE — Assessment & Plan Note (Signed)
Patient presents with approximately one month history of bilateral ear discomfort. She describes on and off pain in her right ear canal that lasts various amounts of time and is improved and worsened by no identifiable trigger. She states that the discomfort will often radiate to her left ear and then back to her right ear. She denies hearing loss, discharge from ear, prior injury, swelling, redness, warmth, imbalance. On physical examination, no surrounding erythema, warmth of tenderness of the ears; examination of the auditory canal is unremarkable and tympanic membrane is clear with no effusion noted.   Patient's presentation is likely benign and secondary to eustachian tube dysfunction. Advised patient to keep a diary of her symptoms and identify triggers that worsen or improve the symptoms. Discussed with patient how to perform Valsalva maneuver to alleviate discomfort.

## 2020-07-10 NOTE — Assessment & Plan Note (Addendum)
Patient presents for follow-up of hyperlipidemia. In February, patient's LDL was 189, therefore rosuvastatin 20mg  daily was initiated. Patient has tolerated medication well with no adverse effects. She has been adherent to once daily dosing.  -Check lipid profile today -Check CMP today -Continue rosuvastatin 20mg  daily

## 2020-07-10 NOTE — Patient Instructions (Signed)
Lorelee Market,  Fue un placer verte hoy.  Para sus niveles altos de colesterol, siga tomando Crestor 20 mg [employer]. Revisaremos sus niveles de colesterol y funcin heptica hoy. Te llamar con [employer]  Para el dolor y la rigidez de la parte superior de la espalda, tengo algunas recomendaciones. Puede aplicar calor en el rea que debera ayudar a reducir la tensin. Adems, puede hacer que alguien masajee (o frote) el rea afectada. Tambin puede aplicar un medicamento tpico como Biofreeze en el rea que puede proporcionar algo de comodidad. Si los sntomas son significativos, considere tomar Tylenol.  Para el malestar de su odo, esto debera mejorar con [employer]. Puede cerrar las fosas nasales y soplar por la nariz para ayudar a reducir la presin Horticulturist, commercial.  Sinceramente, Dr. Paulla Dolly, MD  Ms. Eugenio Hoes,  It was a pleasure seeing you today.  For your high cholesterol levels, please keep taking the crestor 20mg  every day. We will check your cholesterol levels and liver function today. I will call you with the results.  For your upper back pain and tightness, I have a few recommendations. You can apply heat to the area which should help reduce the tension. Also, you can have someone massage (or rub) the affected area. You can also apply a topical medication like Biofreeze to the area which can provide some comfort. If symptoms are significant, please consider taking Tylenol.  For your ear discomfort, this should improve with time. You can close your nostrils and blow out through your nose to help reduce pressure built up in your ear canal.  Sincerely, Dr. Paulla Dolly, MD

## 2020-07-11 LAB — LIPID PANEL
Chol/HDL Ratio: 2.9 ratio (ref 0.0–4.4)
Cholesterol, Total: 192 mg/dL (ref 100–199)
HDL: 67 mg/dL (ref >39)
LDL Chol Calc (NIH): 99 mg/dL (ref 0–99)
Triglycerides: 149 mg/dL (ref 0–149)
VLDL Cholesterol Cal: 26 mg/dL (ref 5–40)

## 2020-07-11 LAB — CMP14 + ANION GAP
ALT: 69 IU/L — ABNORMAL HIGH (ref 0–32)
AST: 55 IU/L — ABNORMAL HIGH (ref 0–40)
Albumin/Globulin Ratio: 1.9 (ref 1.2–2.2)
Albumin: 4.5 g/dL (ref 3.8–4.8)
Alkaline Phosphatase: 59 IU/L (ref 44–121)
Anion Gap: 14 mmol/L (ref 10.0–18.0)
BUN/Creatinine Ratio: 20 (ref 9–23)
BUN: 13 mg/dL (ref 6–24)
Bilirubin Total: 0.3 mg/dL (ref 0.0–1.2)
CO2: 22 mmol/L (ref 20–29)
Calcium: 9.8 mg/dL (ref 8.7–10.2)
Chloride: 103 mmol/L (ref 96–106)
Creatinine, Ser: 0.65 mg/dL (ref 0.57–1.00)
Globulin, Total: 2.4 g/dL (ref 1.5–4.5)
Glucose: 94 mg/dL (ref 65–99)
Potassium: 4.6 mmol/L (ref 3.5–5.2)
Sodium: 139 mmol/L (ref 134–144)
Total Protein: 6.9 g/dL (ref 6.0–8.5)
eGFR: 111 mL/min/{1.73_m2} (ref >59)

## 2020-07-17 NOTE — Progress Notes (Signed)
Internal Medicine Clinic Attending  Case discussed with Dr. Johnson  At the time of the visit.  We reviewed the resident's history and exam and pertinent patient test results.  I agree with the assessment, diagnosis, and plan of care documented in the resident's note.  

## 2020-11-27 ENCOUNTER — Telehealth: Payer: Self-pay

## 2020-11-27 NOTE — Telephone Encounter (Signed)
Health Coaching 3  interpreter- Rudene Anda, [employer]   Goals- Patient is eating more fruits and vegetables. Patient is consuming fruits and vegetables daily now. Patient has also started walking 30 minutes a day daily. Patient recently had lipid profile re-checked in June 2022 and all numbers were now within the normal range. Encouraged patient to continue taking medication daily.   New goal- Continue practicing heart healthy diet with exercise. Continue watching the amount of fried and fatty foods consumed.   Barrier to reaching goal- NA   Strategies to overcome- NA   Navigation:  Patient is aware of  a follow up session. Patient is scheduled for follow-up appointment on December 25, 2020.   Time- 15 minutes

## 2020-12-25 ENCOUNTER — Other Ambulatory Visit: Payer: Self-pay

## 2020-12-25 ENCOUNTER — Inpatient Hospital Stay: Payer: Self-pay | Attending: Obstetrics and Gynecology | Admitting: *Deleted

## 2020-12-25 VITALS — BP 144/88 | Ht 59.0 in | Wt 146.2 lb

## 2020-12-25 DIAGNOSIS — Z0000 Encounter for general adult medical examination without abnormal findings: Secondary | ICD-10-CM

## 2020-12-25 DIAGNOSIS — Z1231 Encounter for screening mammogram for malignant neoplasm of breast: Secondary | ICD-10-CM

## 2020-12-25 NOTE — Progress Notes (Signed)
Wisewoman follow up   Interpreter: Rudene Anda, Erling Cruz  Clinical Measurement:   Vitals:   12/25/20 0843 12/25/20 1009  BP: (!) 150/90 (!) 144/88      Medical History:  Patient states that she has high cholesterol, does not have high blood pressure and she does not have diabetes.  Medications:  Patient states that she does take medication to lower cholesterol, blood pressure and blood sugar.  Patient does not take an aspirin a day to help prevent a heart attack or stroke. During the past 7 days patient has taken prescribed medication to lower blood pressure on 7 days.   Blood pressure, self measurement:  :  Patient states that she does not measure blood pressure from home. She checks her blood pressure N/A. She shares her readings with a health care provider: N/A.   Nutrition: Patient states that on average she eats 2 cups of fruit and 2 cups of vegetables per day. Patient states that she does eat fish at least 2 times per week. Patient eats more than half servings of whole grains. Patient drinks less than 36 ounces of beverages with added sugar weekly: yes. Patient is currently watching sodium or salt intake: yes. In the past 7 days patient has had 0 drinks containing alcohol. On average patient drinks 0 drinks containing alcohol per day.      Physical activity:  Patient states that she gets 0 minutes of moderate and 0 minutes of vigorous physical activity each week.  Smoking status:  Patient states that she has has never smoked .   Quality of life:  Over the past 2 weeks patient states that she had little interest or pleasure in doing things: not at all. She has been feeling down, depressed or hopeless:not at all.    Risk reduction and counseling:   Health Coaching: Spoke with patient about continuing to practice a heart healthy diet, eating recommended amounts of fruits and vegetables daily.  Spoke with patient also about watching the amount of fried and fatty foods consumed and eating  more lean proteins and less red meats. Patient has been unable to exercise recently due to a back injury.    Navigation: This was the  follow up session for this patient, I will check up on her progress in the coming months. Referred patient to Internal Medicine for elevated BP. Will call patient with appointment information once appointment is scheduled. Patient will be eligible to re-screen again for [facility] Woman after 07/10/20.   Time: 20 minutes

## 2020-12-31 ENCOUNTER — Ambulatory Visit (INDEPENDENT_AMBULATORY_CARE_PROVIDER_SITE_OTHER): Payer: Self-pay | Admitting: Internal Medicine

## 2020-12-31 ENCOUNTER — Encounter: Payer: Self-pay | Admitting: Internal Medicine

## 2020-12-31 DIAGNOSIS — E785 Hyperlipidemia, unspecified: Secondary | ICD-10-CM

## 2020-12-31 DIAGNOSIS — R7401 Elevation of levels of liver transaminase levels: Secondary | ICD-10-CM

## 2020-12-31 DIAGNOSIS — R109 Unspecified abdominal pain: Secondary | ICD-10-CM

## 2020-12-31 DIAGNOSIS — S39012S Strain of muscle, fascia and tendon of lower back, sequela: Secondary | ICD-10-CM

## 2020-12-31 NOTE — Patient Instructions (Signed)
Ms.Serenitie Rheanne Cortopassi, it was a pleasure seeing you today!  Today we discussed: Back pain Your back pain is from muscle spasms. This can happen with normal movements.  Please continue taking Cyclobenzaprine and Diclofenac as needed for your back pain.  It is important that you continue to move, I have printed off some exercises that will help your back as you recover.  Cholesterol We will recheck your cholesterol in January. Please continue taking rosuvastatin 20 mg.  I have ordered the following labs today:  Lab Orders  No laboratory test(s) ordered today      Referrals ordered today:   Referral Orders  No referral(s) requested today     I have ordered the following medication/changed the following medications:   Stop the following medications: There are no discontinued medications.   Start the following medications: No orders of the defined types were placed in this encounter.    Follow-up:  4 weeks    Please make sure to arrive 15 minutes prior to your next appointment. If you arrive late, you may be asked to reschedule.   We look forward to seeing you next time. Please call our clinic at [phone] if you have any questions or concerns. The best time to call is Monday-Friday from 9am-4pm, but there is someone available 24/7. If after hours or the weekend, call the main hospital number and ask for the Internal Medicine Resident On-Call. If you need medication refills, please notify your pharmacy one week in advance and they will send [place] a request.  Thank you for letting us take part in your care. Wishing you the best!  Thank you, Dr. [facility]

## 2020-12-31 NOTE — Progress Notes (Signed)
Subjective:  CC: back pain and follow-up on hyperlipidemia  HPI:  [name] is a 45 y.o. female with a past medical history stated below and presents today for back pain and follow-up on hyperlipidemia. Please see problem based assessment and plan for additional details.  Past Medical History:  Diagnosis Date   Abdominal pain, chronic, right lower quadrant 10/05/2019   Blood transfusion without reported diagnosis    Fibroids    GERD (gastroesophageal reflux disease)    no meds   Hyperlipidemia    Postpartum care following vaginal delivery 09/30/2010   Trapezius muscle strain 07/10/2020    Current Outpatient Medications on File Prior to Visit  Medication Sig Dispense Refill   cyclobenzaprine (FLEXERIL) 10 MG tablet Take 10 mg by mouth 2 (two) times daily as needed.     diclofenac (VOLTAREN) 75 MG EC tablet Take 75 mg by mouth 2 (two) times daily as needed.     acetaminophen (TYLENOL) 325 MG tablet Take 2 tablets (650 mg total) by mouth every 4 (four) hours as needed. 100 tablet 0   dicyclomine (BENTYL) 10 MG capsule Take 1 capsule (10 mg total) by mouth 3 (three) times daily as needed for spasms (abdominal cramping). (Patient not taking: Reported on 12/25/2020) 90 capsule 11   Menthol, Topical Analgesic, (BIOFREEZE) 4 % GEL Apply 1 application topically as needed (To trapezius muscles). (Patient not taking: Reported on 12/25/2020) 74 mL 0   omeprazole (PRILOSEC) 40 MG capsule Take 1 capsule (40 mg total) by mouth daily. (Patient not taking: Reported on 12/25/2020) 30 capsule 11   rosuvastatin (CRESTOR) 20 MG tablet Take 1 tablet (20 mg total) by mouth daily. 30 tablet 11   No current facility-administered medications on file prior to visit.    Family History  Problem Relation Age of Onset   Hypertension Mother    Heart disease Mother    Uterine cancer Maternal Grandmother    Thyroid disease Son    Stomach cancer Maternal Uncle    Colon cancer Neg Hx    Pancreatic  cancer Neg Hx    Esophageal cancer Neg Hx     Social History   Socioeconomic History   Marital status: Married    Spouse name: Not on file   Number of children: 4   Years of education: Not on file   Highest education level: 6th grade  Occupational History   Occupation: Cleaning services  Tobacco Use   Smoking status: Never   Smokeless tobacco: Never  Vaping Use   Vaping Use: Never used  Substance and Sexual Activity   Alcohol use: No   Drug use: No   Sexual activity: Yes    Birth control/protection: Condom  Other Topics Concern   Not on file  Social History Narrative   Not on file   Social Determinants of Health   Financial Resource Strain: Not on file  Food Insecurity: No Food Insecurity   Worried About Running Out of Food in the Last Year: Never true   [place] in the Last Year: Never true  Transportation Needs: No Transportation Needs   Lack of Transportation (Medical): No   Lack of Transportation (Non-Medical): No  Physical Activity: Not on file  Stress: Not on file  Social Connections: Not on file  Intimate Partner Violence: Not on file    Review of Systems: ROS negative except for what is noted on the assessment and plan.  Objective:   Vitals:   12/31/20  1420  BP: 133/67  Pulse: 81  Temp: 98.4 F (36.9 C)  TempSrc: Oral  SpO2: 100%  Weight: 148 lb 6.4 oz (67.3 kg)    Physical Exam: Gen: A&O x3 and in no apparent distress, well appearing and nourished. HEENT:    Head - normocephalic, atraumatic.    Eye - visual acuity grossly intact, conjunctiva clear, sclera non-icteric, EOM intact.    Mouth - No obvious caries or periodontal disease. Neck: no masses or nodules, AROM intact. CV: RRR, no murmurs, S1/S2 presents  Resp: Clear to auscultation bilaterally  Abd: BS (+) x4, soft, non-tender abdomen, without hepatosplenomegaly or masses MSK: tenderness to palpation along the paraspinal muscles of lumbar, around L4-L5.  She reports tenderness  along lower lumbar across paraspinal, transverse process, and spinous process.  No erythema or ecchymosis noted.  No pain in hip with log roll of leg bilaterally. Straight raise negative bilaterally, muscle strength in hip flexion and extension 5 out of 5 strength bilaterally, in knee flexion and extension 5 out of 5 bilaterally, and in flexion and extension 5 out of 5 bilaterally.  Sensation intact in dermatomes L4, L5, and S1. Patellar reflexes 2+ bilaterally. Skin: good skin turgor, no rashes, unusual bruising, or prominent lesions.  Neuro: No focal deficits, grossly normal sensation and coordination.  Psych: Oriented x3 and responding appropriately. Intact memory, normal mood, judgement, affect, and insight.    Assessment & Plan:  See Encounters Tab for problem based charting.  Patient seen with Dr. Erroll Luna Jihaad Bruschi, D.O. [facility]  PGY-1 Pager: [phone]  Phone: [phone] Date 01/01/2021  Time 7:18 AM

## 2021-01-01 ENCOUNTER — Encounter: Payer: Self-pay | Admitting: Internal Medicine

## 2021-01-01 DIAGNOSIS — S39012A Strain of muscle, fascia and tendon of lower back, initial encounter: Secondary | ICD-10-CM | POA: Insufficient documentation

## 2021-01-01 DIAGNOSIS — R7401 Elevation of levels of liver transaminase levels: Secondary | ICD-10-CM | POA: Insufficient documentation

## 2021-01-01 NOTE — Assessment & Plan Note (Addendum)
Patient had elevated transaminases on CMP in June of 2022.  AST 55, ALT at 69.  Care ultrasound performed in 2021 in clinic due to patient's right upper quadrant pain.  No signs at that time concerning for cholelithiasis.  Assessment: Differentials for mildly elevated transaminases are not provided, including nonalcoholic fatty liver disease, autoimmune hepatitis, and chronic viral hepatitis.  Plan: -Recheck CMP at follow-up visit.  Patient does not currently have the orange card, but states that she has the paperwork and she will be applying for it.

## 2021-01-01 NOTE — Progress Notes (Signed)
Internal Medicine Clinic Attending  I saw and evaluated the patient.  I personally confirmed the key portions of the history and exam documented by Dr. Masters and I reviewed pertinent patient test results.  The assessment, diagnosis, and plan were formulated together and I agree with the documentation in the resident's note.  

## 2021-01-01 NOTE — Assessment & Plan Note (Signed)
Patient states that on November 16 she was bending over and twisting when she had a sudden pain in her lower lumbar region.  She reports following this she had difficulty with movement and increased pain.  She states pain travels down into her hips R>L. She went to be evaluated and was given cyclobenzaprine 10 mg and diclofenac 75 mg.  She states she has been taking this twice daily and it has helped gradually.  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  This is happened before and was improved with cyclobenzaprine. Denies change in bladder control and saddle anesthesia.  She states that her legs do feel weak.  On physical exam she has tenderness to palpation along the paraspinal muscles of lumbar, around L4-L5.  She reports tenderness along lower lumbar across paraspinal, transverse process, and spinous process.  No erythema or ecchymosis noted.  No pain in hip with log roll of leg bilaterally. Straight raise negative bilaterally, muscle strength in hip flexion and extension 5 out of 5 strength bilaterally, in knee flexion and extension 5 out of 5 bilaterally, and in flexion and extension 5 out of 5 bilaterally.  Sensation intact in dermatomes L4, L5, and S1. Patellar reflexes 2+ bilaterally.  Assessment: Signs and symptoms consistent with muscle strain of lumbar. Patient does report subjective leg weakness, but muscle strength in hip and knee F/E 5/5.   Plan: -Patient told to continue taking cyclobenzaprine and diclofenac as needed, she was given 15 day course. -She was given back exercises to complete and encouraged to do exercises as tolerated to help her back recover. -Follow-up in four weeks

## 2021-01-01 NOTE — Assessment & Plan Note (Signed)
Patient was started on high intensity statin  in February 2022 for LDL of 189.  On repeat lipid panel in June of this year.  LDL plan from 189-99.  Patient reports that she is adherent with taking rosuvastatin 20 mg.  Denies side effects from this medication.  Plan: -Continue rosuvastatin 20 mg

## 2021-01-28 ENCOUNTER — Encounter: Payer: Self-pay | Admitting: Internal Medicine

## 2021-04-10 ENCOUNTER — Other Ambulatory Visit: Payer: Self-pay

## 2021-04-10 ENCOUNTER — Ambulatory Visit
Admission: RE | Admit: 2021-04-10 | Discharge: 2021-04-10 | Disposition: A | Discharge status: Home / Self Care | Payer: No Typology Code available for payment source | Source: Ambulatory Visit | Attending: Obstetrics and Gynecology | Admitting: Obstetrics and Gynecology

## 2021-04-10 ENCOUNTER — Ambulatory Visit: Payer: Self-pay | Admitting: *Deleted

## 2021-04-10 ENCOUNTER — Encounter (INDEPENDENT_AMBULATORY_CARE_PROVIDER_SITE_OTHER): Payer: Self-pay

## 2021-04-10 VITALS — BP 122/68 | Wt 145.6 lb

## 2021-04-10 DIAGNOSIS — Z1239 Encounter for other screening for malignant neoplasm of breast: Secondary | ICD-10-CM

## 2021-04-10 DIAGNOSIS — Z1231 Encounter for screening mammogram for malignant neoplasm of breast: Secondary | ICD-10-CM

## 2021-04-10 DIAGNOSIS — Z1211 Encounter for screening for malignant neoplasm of colon: Secondary | ICD-10-CM

## 2021-04-10 NOTE — Progress Notes (Signed)
[name] is a 46 y.o. female who presents to [facility] clinic today with no complaints.    Pap Smear: Pap smear not completed today. Last Pap smear was 04/04/2018 at the free cervical cancer screening at the [facility] and normal. Per patient has no history of an abnormal Pap smear. Last Pap smear result is available in Epic.   Physical exam: Breasts Left breast slightly larger than right breast that was noted on previous exam 01/11/2020. No skin abnormalities bilateral breasts. No nipple retraction bilateral breasts. No nipple discharge bilateral breasts. No lymphadenopathy. No lumps palpated bilateral breasts. No complaints of pain or tenderness on exam.   MS DIGITAL SCREENING BILATERAL  Addendum Date: 12/10/2016   ADDENDUM REPORT: 12/10/2016 09:33 ADDENDUM: Note that there is a right/ left discrepancy in the above report as the possible mass is actually within the right breast. Electronically Signed   By: Marin Olp M.D.   On: 12/10/2016 09:33   Result Date: 12/10/2016 CLINICAL DATA:  Screening. EXAM: DIGITAL SCREENING BILATERAL MAMMOGRAM WITH CAD COMPARISON:  None. ACR Breast Density Category c: The breast tissue is heterogeneously dense, which may obscure small masses. FINDINGS: In the left breast, a possible mass warrants further evaluation. In the right breast, no findings suspicious for malignancy. Images were processed with CAD. IMPRESSION: Further evaluation is suggested for possible mass in the left breast. RECOMMENDATION: Diagnostic mammogram and possibly ultrasound of the left breast. (Code:FI-L-70M) The patient will be contacted regarding the findings, and additional imaging will be scheduled. BI-RADS CATEGORY  0: Incomplete. Need additional imaging evaluation and/or prior mammograms for comparison. Electronically Signed: By: Marin Olp M.D. On: 11/20/2016 09:48   MM DIAG BREAST TOMO BILATERAL  Result Date: 12/21/2017 CLINICAL DATA:  One year follow-up for  probably benign RIGHT breast mass. EXAM: DIGITAL DIAGNOSTIC BILATERAL MAMMOGRAM WITH CAD AND TOMO ULTRASOUND RIGHT BREAST COMPARISON:  06/02/2017 and earlier ACR Breast Density Category c: The breast tissue is heterogeneously dense, which may obscure small masses. FINDINGS: A circumscribed oval mass is identified in the UPPER-OUTER QUADRANT of the RIGHT breast, stable mammographically since the previous exams. No new mass, distortion, or suspicious microcalcifications are identified in the breast. Mammographic images were processed with CAD. Targeted ultrasound is performed, showing a circumscribed oval hypoechoic parallel mass in the 10 o'clock location of the RIGHT breast 3 centimeters from the nipple which measures 1.6 x 1.3 x 0.8 centimeters. An adjacent benign cyst is again noted. Previously, mass measured 1.5 x 1.6 x 1.2 centimeters. IMPRESSION: Stable to slightly smaller probable fibroadenoma in the 10 o'clock location of the RIGHT breast. No mammographic or ultrasound evidence for malignancy. RECOMMENDATION: Bilateral diagnostic mammogram and RIGHT breast ultrasound recommended in 1 year. I have discussed the findings and recommendations with the patient. Results were also provided in writing at the conclusion of the visit. If applicable, a reminder letter will be sent to the patient regarding the next appointment. BI-RADS CATEGORY  3: Probably benign. Electronically Signed   By: Nolon Nations M.D.   On: 12/21/2017 15:51   MM DIAG BREAST TOMO BILATERAL  Result Date: 12/01/2016 CLINICAL DATA:  Screening recall for a right breast mass.Upon review of the screening study, there is also a subtle area of asymmetry in the left breast noted on the CC view. EXAM: 2D DIGITAL DIAGNOSTIC BILATERAL MAMMOGRAM WITH CAD AND ADJUNCT TOMO ULTRASOUND RIGHT BREAST COMPARISON:  Screening study, 11/19/2016.  No other prior studies. ACR Breast Density Category c: The breast tissue  is heterogeneously dense, which may  obscure small masses. FINDINGS: On the right, the mass in the upper outer quadrant persists. It is oval and circumscribed. There are no other right breast masses, no areas of architectural distortion and no suspicious calcifications. On the left, the subtle area of asymmetry disperses on the diagnostic 3D images consistent with superimposed fibroglandular tissue. There are no discrete masses or areas of architectural distortion. No suspicious calcifications. Mammographic images were processed with CAD. On physical exam, no mass is palpated in the upper outer right breast. Targeted ultrasound is performed, showing an oval circumscribed hypoechoic solid mass in the right breast at 10 o'clock, 3 cm the nipple, measuring 16 x 12 x 15 mm. This corresponds in size, shape and location to the mammographic abnormality. IMPRESSION: 1. 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 Short-term follow-up is recommended. 2. No evidence of left breast malignancy. RECOMMENDATION: Right breast ultrasound in 6 months. I have discussed the findings and recommendations with the patient. Results were also provided in writing at the conclusion of the visit. If applicable, a reminder letter will be sent to the patient regarding the next appointment. BI-RADS CATEGORY  3: Probably benign. Electronically Signed   By: Lajean Manes M.D.   On: 12/01/2016 12:12   MS DIGITAL SCREENING TOMO BILATERAL  Result Date: 01/12/2020 CLINICAL DATA:  Screening. EXAM: DIGITAL SCREENING BILATERAL MAMMOGRAM WITH TOMO AND CAD COMPARISON:  Previous exam(s). ACR Breast Density Category c: The breast tissue is heterogeneously dense, which may obscure small masses. FINDINGS: There are no findings suspicious for malignancy. Images were processed with CAD. IMPRESSION: No mammographic evidence of malignancy. A result letter of this screening mammogram will be mailed directly to the patient. RECOMMENDATION: Screening mammogram in one year.  (Code:SM-B-01Y) BI-RADS CATEGORY  1: Negative. Electronically Signed   By: Franki Cabot M.D.   On: 01/12/2020 16:13   MS DIGITAL DIAG TOMO BILAT  Result Date: 01/04/2019 CLINICAL DATA:  46 year old female for 2 year follow-up of RIGHT breast mass and for annual bilateral mammogram. EXAM: DIGITAL DIAGNOSTIC BILATERAL MAMMOGRAM WITH CAD AND TOMO ULTRASOUND RIGHT BREAST COMPARISON:  Previous exam(s). ACR Breast Density Category d: The breast tissue is extremely dense, which lowers the sensitivity of mammography. FINDINGS: 2D/3D full field views of both breasts again demonstrate circumscribed oval mass within the UPPER-OUTER RIGHT breast. No new mass, distortion or worrisome calcifications noted. Mammographic images were processed with CAD. Targeted ultrasound is performed, showing a 0.9 x 0.6 x 1.3 cm circumscribed oval hypoechoic parallel mass at the 10 o'clock position of the RIGHT breast 3 cm from the nipple, previously 1.3 x 0.8 x 1.6 cm. IMPRESSION: 1. Decreasing size of UPPER-OUTER RIGHT breast mass, compatible with a benign mass/fibroadenoma. 2. No mammographic evidence of breast malignancy. RECOMMENDATION: Bilateral screening mammogram in 1 year. I have discussed the findings and recommendations with the patient. If applicable, a reminder letter will be sent to the patient regarding the next appointment. BI-RADS CATEGORY  2: Benign. Electronically Signed   By: Margarette Canada M.D.   On: 01/04/2019 09:51     Pelvic/Bimanual Pap smear is due. Patient is currently on menstrual period and unable to complete Pap smear today. Scheduled Pap smear for Tuesday, April 22, 2021 at [facility].   Smoking History: Patient has never smoked.   Patient Navigation: Patient education provided. Access to services provided for patient through [facility] program. Spanish interpreter Edwinna Areola from [facility] provided.  Colorectal Cancer Screening: Per patient has had colonoscopy completed  on 12/28/2019 at [place].  FIT Test given  to patient to complete. No complaints today.    Breast and Cervical Cancer Risk Assessment: Patient does not have family history of breast cancer, known genetic mutations, or radiation treatment to the chest before age 11. Patient does not have history of cervical dysplasia, immunocompromised, or DES exposure in-utero.  Risk Assessment     Risk Scores       04/10/2021 01/11/2020   Last edited by: Demetrius Revel, LPN Royston Bake, CMA   5-year risk: 0.5 % 0.4 %   Lifetime risk: 5.6 % 5.6 %            A: [facility] exam without pap smear No complaints.  P: Referred patient to the [facility] for a screening mammogram on mobile unit. Appointment scheduled Thursday, April 10, 2021 at 1000.  Loletta Parish, RN 04/10/2021 9:35 AM

## 2021-04-10 NOTE — Patient Instructions (Addendum)
Explained breast self awareness Chelsea Yates. Pap smear is due. Patient is currently on menstrual period and unable to complete Pap smear today. Scheduled Pap smear for Tuesday, April 22, 2021 at [place] [facility]. Let her know [facility] will cover Pap smears every 3 years unless has a history of abnormal Pap smears. Referred patient to the [facility] for a screening mammogram on mobile unit. Appointment scheduled Thursday, April 10, 2021 at 1000. Patient aware of appointments and will be there. Let patient know the [facility] will follow up with her within the next couple weeks with results of mammogram by letter or phone. Chelsea Yates verbalized understanding. ? ?Cregg Jutte, Arvil Chaco, RN ?9:35 AM ? ? ? ? ?

## 2021-04-20 LAB — FECAL OCCULT BLOOD, IMMUNOCHEMICAL: Fecal Occult Bld: NEGATIVE

## 2021-04-22 ENCOUNTER — Other Ambulatory Visit: Payer: Self-pay | Admitting: *Deleted

## 2021-04-22 ENCOUNTER — Other Ambulatory Visit: Payer: Self-pay

## 2021-04-22 ENCOUNTER — Telehealth: Payer: Self-pay

## 2021-04-22 DIAGNOSIS — Z1211 Encounter for screening for malignant neoplasm of colon: Secondary | ICD-10-CM

## 2021-04-22 DIAGNOSIS — N898 Other specified noninflammatory disorders of vagina: Secondary | ICD-10-CM

## 2021-04-22 DIAGNOSIS — Z124 Encounter for screening for malignant neoplasm of cervix: Secondary | ICD-10-CM

## 2021-04-22 NOTE — Telephone Encounter (Signed)
During office today, with the assistance of Edwinna Areola, [place] [facility] Cruz), Patient informed negative FIT test results. Patient verbalized understanding.  ?

## 2021-04-22 NOTE — Progress Notes (Signed)
Patient: Chelsea Yates           ?Date of Birth: 14-Mar-1975           ?MRN: [id number] ?Visit Date: 04/22/2021 ?PCP: Mike Craze, DO ? ?Cervical Cancer Screening ?Do you smoke?: No ?Have you ever had or been told you have an allergy to latex products?: No ?Marital status: Married ?Date of last pap smear: 2-5 yrs ago (04/04/2018-Negative (Screening)) ?Date of last menstrual period: 04/07/21 ?Number of pregnancies: 5 ?Number of births: 4 ?Have you ever had any of the following? ?Hysterectomy: No ?Tubal ligation (tubes tied): No ?Abnormal bleeding: No ?Abnormal pap smear: No ?Venereal warts: No ?A sex partner with venereal warts: No ?A high risk* sex partner: No ? ?Cervical Exam ? ?Abnormal Observations: Thick yellowish to white colored discharge observed in vagina. Wet prep completed. Cervix friable. ?Recommendations: Last Pap smear was 04/04/2018 at the free cervical cancer screening at the [facility] and normal. Per patient has no history of an abnormal Pap smear. Last Pap smear result is available in Epic. Let patient know if today's Pap smear is normal and HPV negative that her next Pap smear will be due in 5 years. Informed patient that will call her within the next week with results of her wet prep and Pap smear by phone. Patient verbalized understanding. ? ? ?Spanish interpreter Chelsea Yates from [facility] provided. ? ?Patient's History ?Patient Active Problem List  ? Diagnosis Date Noted  ? Elevated transaminase level 01/01/2021  ? Strain of lumbar paraspinal muscle 01/01/2021  ? Healthcare maintenance 07/10/2020  ? Eustachian tube dysfunction, bilateral 07/10/2020  ? 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 Hyperlipidemia 02/22/2019  ? RUQ pain 02/22/2019  ? ?Past Medical History:  ?Diagnosis Date  ? Abdominal pain, chronic, right lower quadrant 10/05/2019  ? Blood transfusion without reported diagnosis   ? Fibroids   ? GERD (gastroesophageal reflux disease)   ? no meds  ? Hyperlipidemia   ?  Postpartum care following vaginal delivery 09/30/2010  ? Trapezius muscle strain 07/10/2020  ?  ?Family History  ?Problem Relation Age of Onset  ? Hypertension Mother   ? Heart disease Mother   ? Stomach cancer Maternal Uncle   ? Uterine cancer Maternal Grandmother   ? Thyroid disease Son   ? Colon cancer Neg Hx   ? Pancreatic cancer Neg Hx   ? Esophageal cancer Neg Hx   ? Breast cancer Neg Hx   ?  ?Social History  ? ?Occupational History  ? Occupation: Cleaning services  ?Tobacco Use  ? Smoking status: Never  ? Smokeless tobacco: Never  ?Vaping Use  ? Vaping Use: Never used  ?Substance and Sexual Activity  ? Alcohol use: No  ? Drug use: No  ? Sexual activity: Yes  ?  Birth control/protection: Condom  ? ?

## 2021-04-23 LAB — CERVICOVAGINAL ANCILLARY ONLY
Bacterial Vaginitis (gardnerella): NEGATIVE
Candida Glabrata: NEGATIVE
Candida Vaginitis: NEGATIVE
Comment: NEGATIVE
Comment: NEGATIVE
Comment: NEGATIVE
Comment: NEGATIVE
Trichomonas: NEGATIVE

## 2021-04-24 ENCOUNTER — Telehealth: Payer: Self-pay

## 2021-04-24 LAB — CYTOLOGY - PAP
Comment: NEGATIVE
Diagnosis: NEGATIVE
Diagnosis: REACTIVE
High risk HPV: NEGATIVE

## 2021-04-24 NOTE — Telephone Encounter (Signed)
Called patient via Edwinna Areola, [facility] Cruz to give pap smear results. Informed patient that pap smear was normal and HPV was negative. Based on this result her next pap smear will be due in 5 years. Patient voiced understanding.  ? ?Informed patient that wet prep was also negative. Patient voiced understanding. ?

## 2021-05-26 ENCOUNTER — Other Ambulatory Visit: Payer: Self-pay

## 2021-05-26 DIAGNOSIS — E785 Hyperlipidemia, unspecified: Secondary | ICD-10-CM

## 2021-05-27 MED ORDER — ROSUVASTATIN CALCIUM 20 MG PO TABS: 20.0000 mg | ORAL_TABLET | Freq: Every day | ORAL | 11 refills | Status: DC

## 2021-07-01 ENCOUNTER — Ambulatory Visit: Payer: No Typology Code available for payment source

## 2021-07-28 IMAGING — MG DIGITAL DIAGNOSTIC BILAT W/ TOMO W/ CAD
8 series · 9 of 24 positions shown · non-contrast
Comparison: Previous exam(s).

CLINICAL DATA: 43-year-old female for 2 year follow-up of RIGHT
breast mass and for annual bilateral mammogram.

EXAM:
DIGITAL DIAGNOSTIC BILATERAL MAMMOGRAM WITH CAD AND TOMO
ULTRASOUND RIGHT BREAST

[R MLO synth-2D]
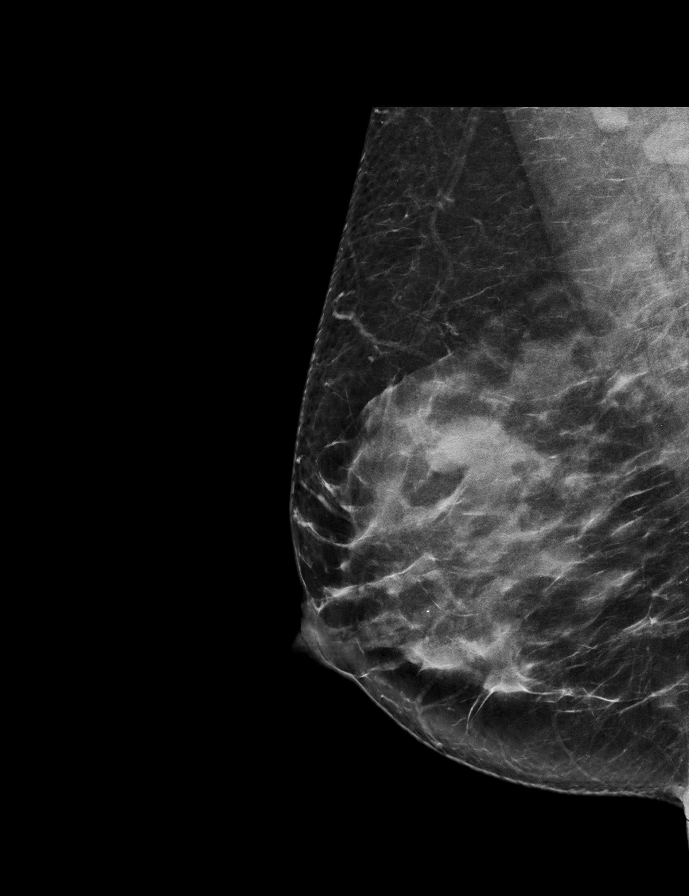

[L MLO synth-2D]
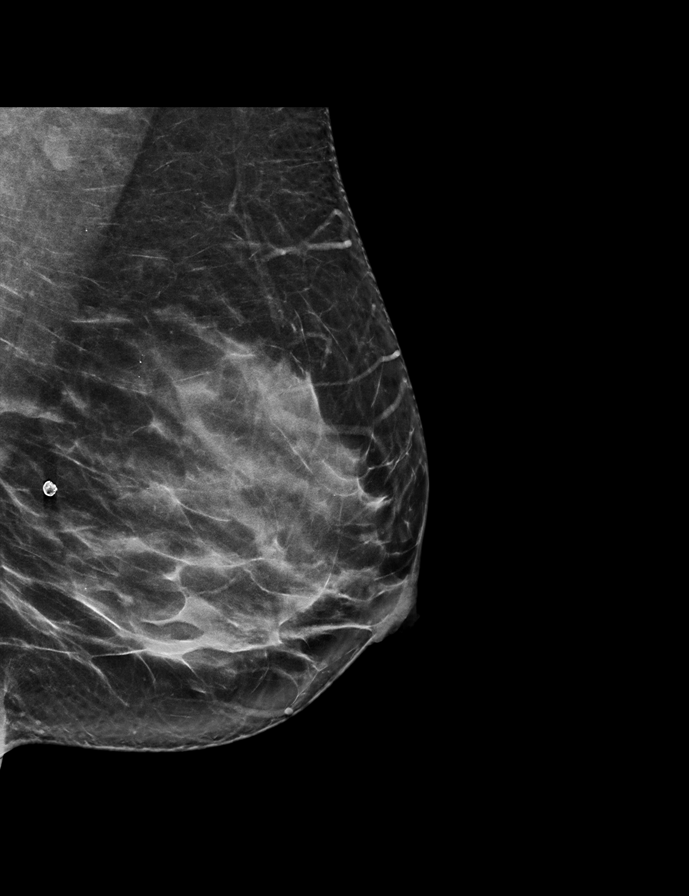

[R CC synth-2D]
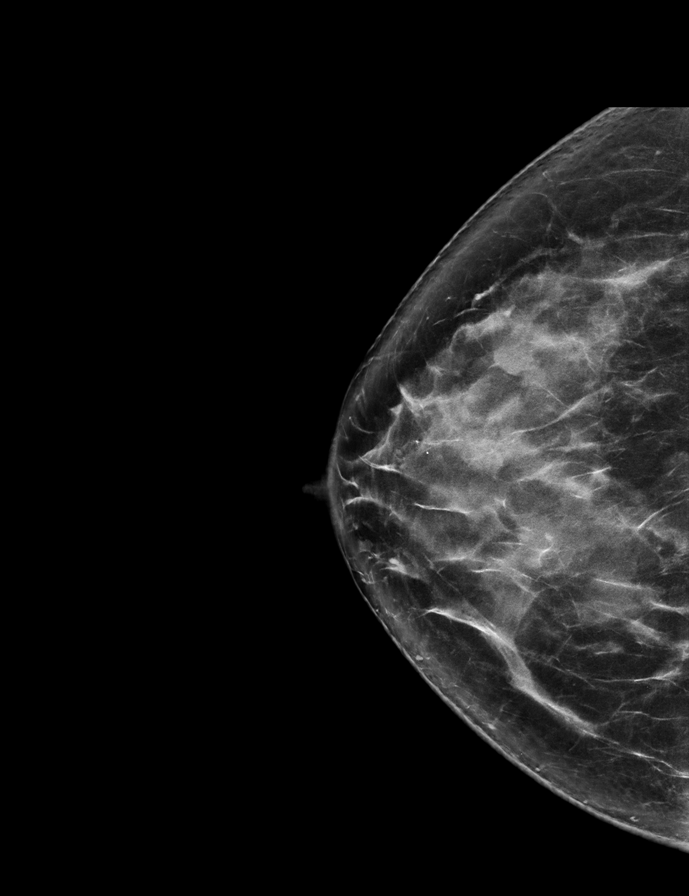

[L CC synth-2D]
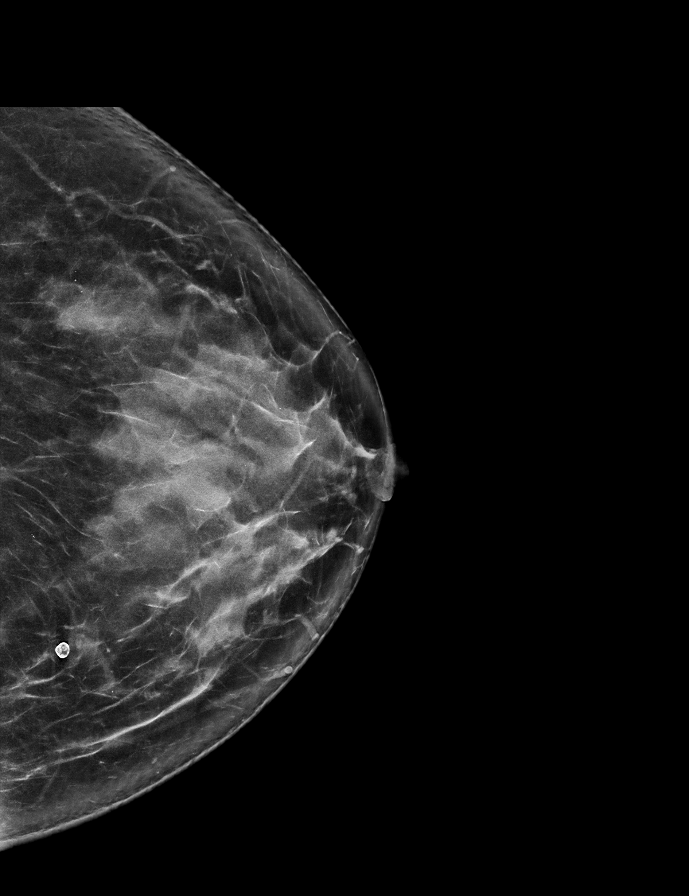

[L CC tomo · 2 of 72 frames shown]
[frame 24/72]
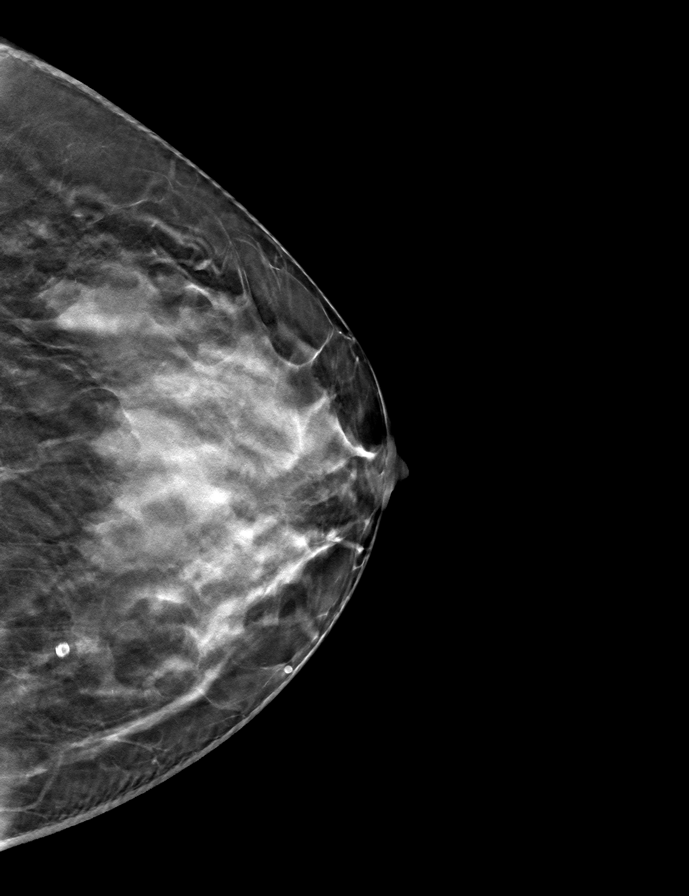
[frame 37/72]
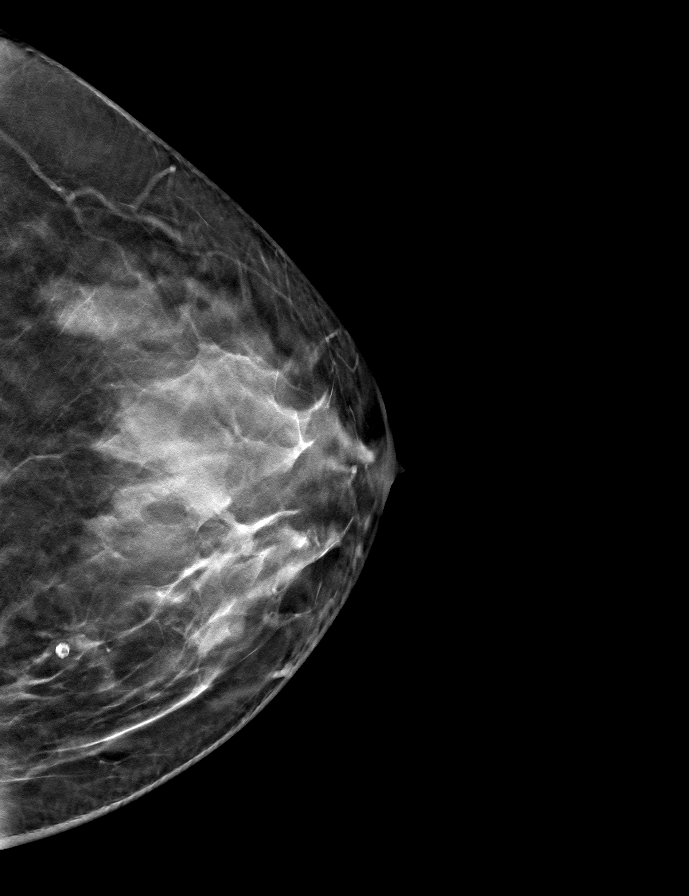

[R CC tomo · tomo slice 37/74.0]
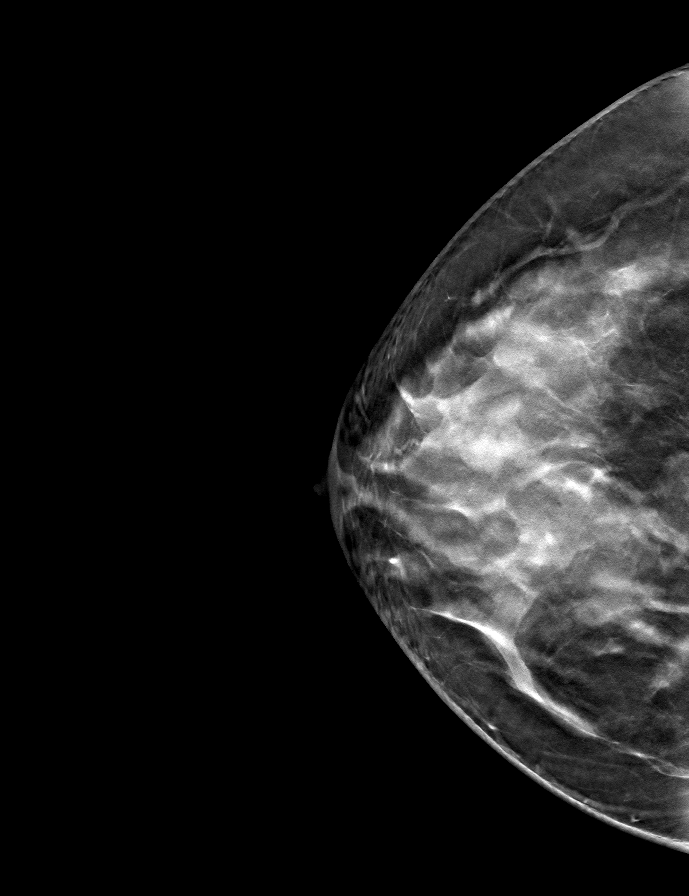

[R MLO tomo · tomo slice 38/75.0]
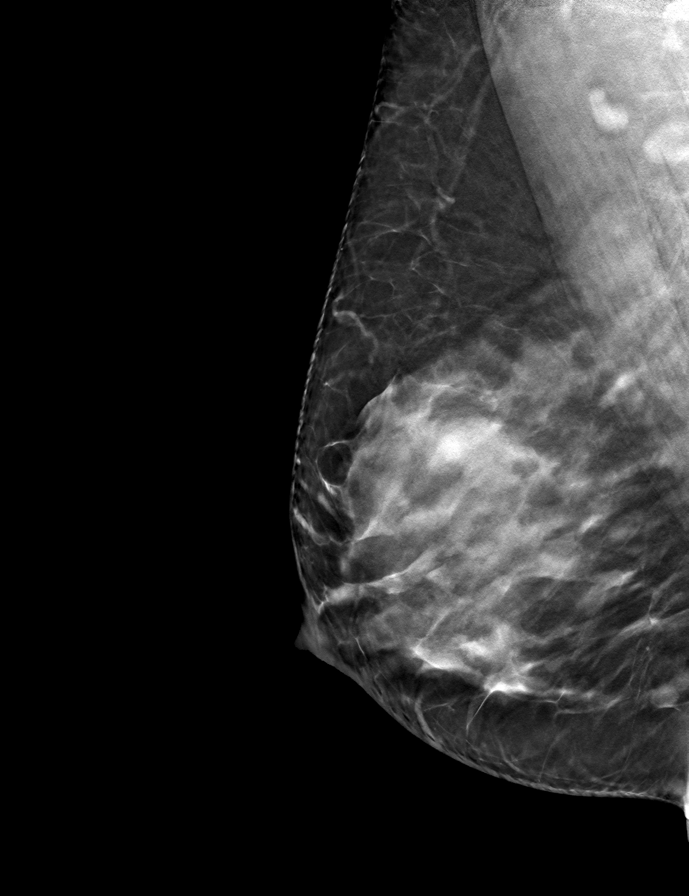

[L MLO tomo · tomo slice 36/71.0]
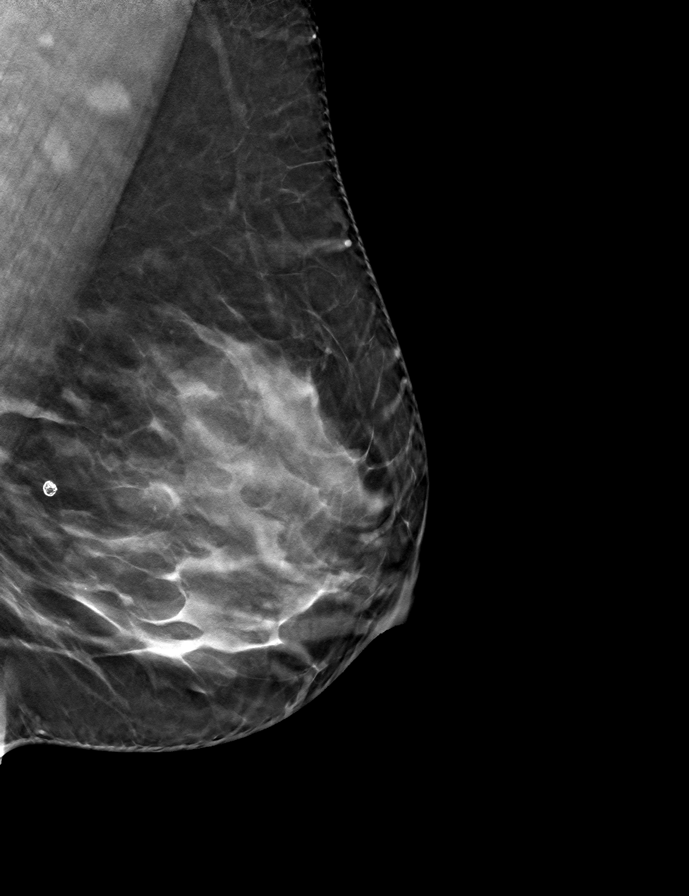

[9 of 24 positions shown; findings below may reference images not displayed]

ACR Breast Density Category d: The breast tissue is extremely dense,
which lowers the sensitivity of mammography.
FINDINGS: 2D/3D full field views of both breasts again demonstrate
circumscribed oval mass within the UPPER-OUTER RIGHT breast.

No new mass, distortion or worrisome calcifications noted.

Mammographic images were processed with CAD.

Targeted ultrasound is performed, showing a 0.9 x 0.6 x 1.3 cm
circumscribed oval hypoechoic parallel mass at the 10 o'clock
position of the RIGHT breast 3 cm from the nipple, previously 1.3 x
0.8 x 1.6 cm.
IMPRESSION: 1. Decreasing size of UPPER-OUTER RIGHT breast mass, compatible with
a benign mass/fibroadenoma.
2. No mammographic evidence of breast malignancy.

RECOMMENDATION:
Bilateral screening mammogram in 1 year.

I have discussed the findings and recommendations with the patient.
If applicable, a reminder letter will be sent to the patient
regarding the next appointment.

BI-RADS CATEGORY  2: Benign.

## 2021-07-28 IMAGING — US US BREAST*R* LIMITED INC AXILLA
1 series · 4 of 4 positions shown · non-contrast
Comparison: Previous exam(s).

CLINICAL DATA: 43-year-old female for 2 year follow-up of RIGHT
breast mass and for annual bilateral mammogram.

EXAM:
DIGITAL DIAGNOSTIC BILATERAL MAMMOGRAM WITH CAD AND TOMO
ULTRASOUND RIGHT BREAST

[Series 1: us breast*right* limited inc axilla · 0.06mm/px · 4 of 4 slices shown]
[im 1/4]
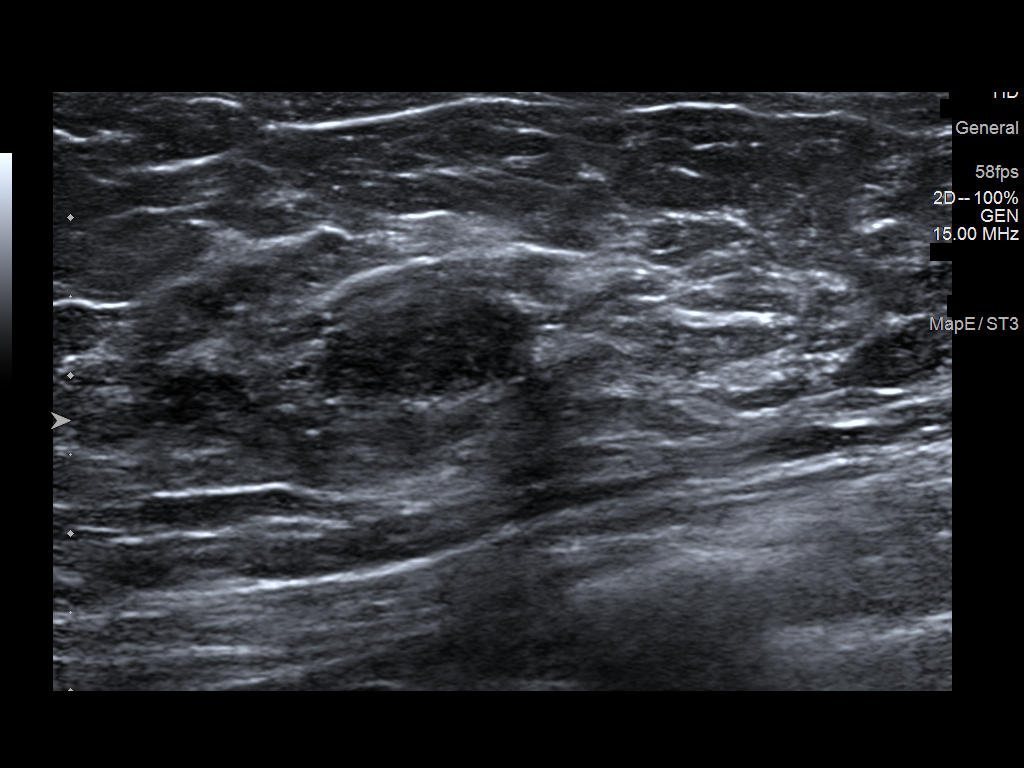
[im 2/4]
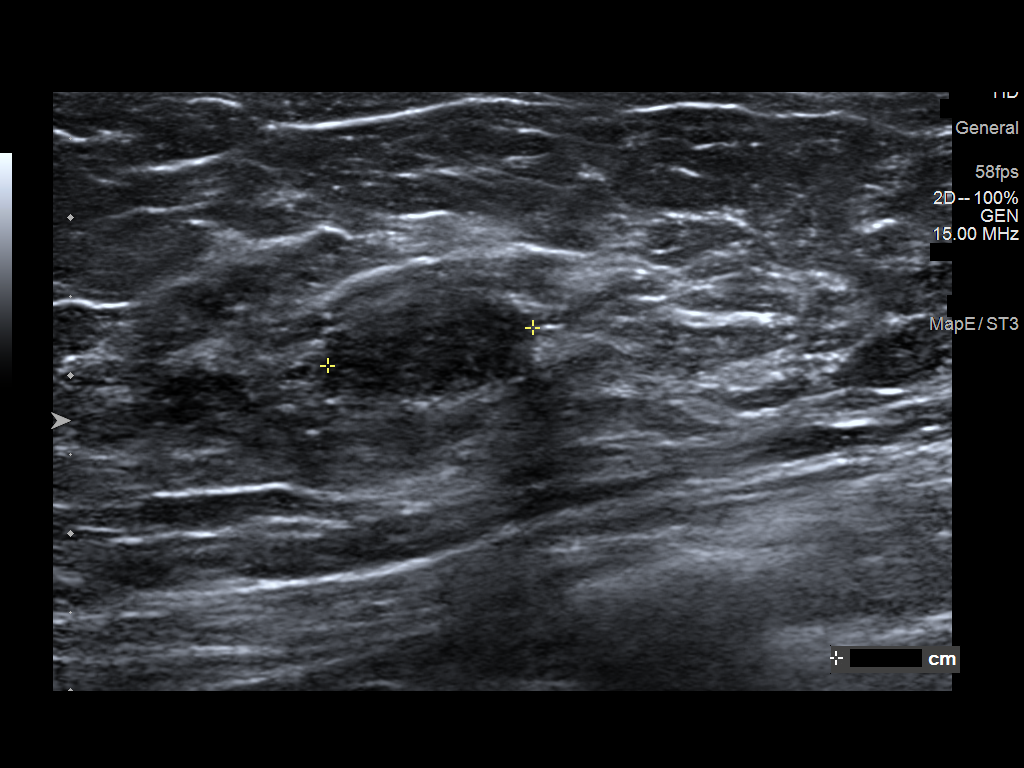
[im 3/4]
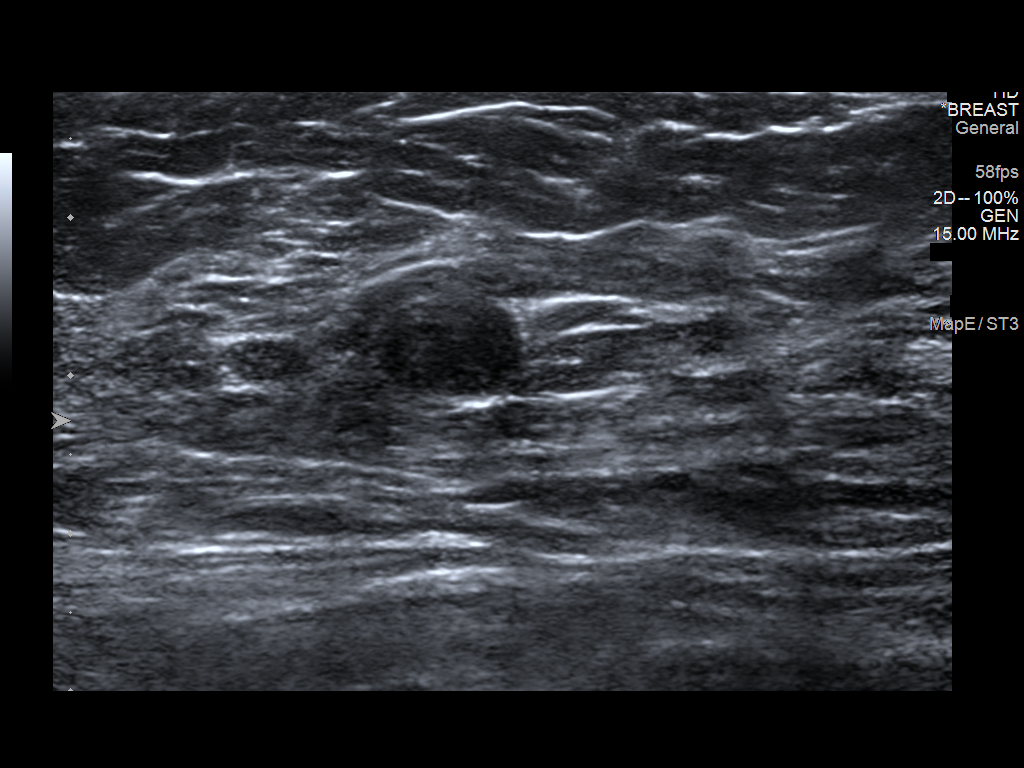
[im 4/4]
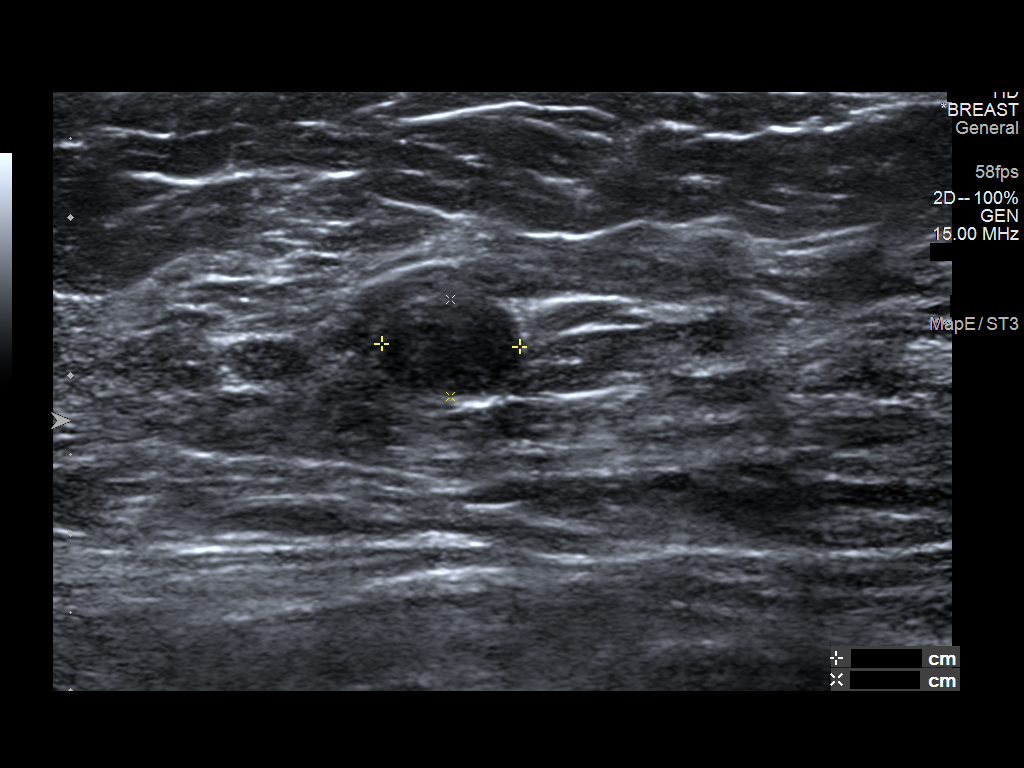

[4 of 4 positions shown; findings below may reference images not displayed]

ACR Breast Density Category d: The breast tissue is extremely dense,
which lowers the sensitivity of mammography.
FINDINGS: 2D/3D full field views of both breasts again demonstrate
circumscribed oval mass within the UPPER-OUTER RIGHT breast.

No new mass, distortion or worrisome calcifications noted.

Mammographic images were processed with CAD.

Targeted ultrasound is performed, showing a 0.9 x 0.6 x 1.3 cm
circumscribed oval hypoechoic parallel mass at the 10 o'clock
position of the RIGHT breast 3 cm from the nipple, previously 1.3 x
0.8 x 1.6 cm.
IMPRESSION: 1. Decreasing size of UPPER-OUTER RIGHT breast mass, compatible with
a benign mass/fibroadenoma.
2. No mammographic evidence of breast malignancy.

RECOMMENDATION:
Bilateral screening mammogram in 1 year.

I have discussed the findings and recommendations with the patient.
If applicable, a reminder letter will be sent to the patient
regarding the next appointment.

BI-RADS CATEGORY  2: Benign.

## 2022-03-11 ENCOUNTER — Other Ambulatory Visit: Payer: Self-pay | Admitting: Obstetrics and Gynecology

## 2022-03-11 DIAGNOSIS — Z1231 Encounter for screening mammogram for malignant neoplasm of breast: Secondary | ICD-10-CM

## 2022-04-16 ENCOUNTER — Ambulatory Visit
Admission: RE | Admit: 2022-04-16 | Discharge: 2022-04-16 | Disposition: A | Discharge status: Home / Self Care | Payer: No Typology Code available for payment source | Source: Ambulatory Visit | Attending: Obstetrics and Gynecology | Admitting: Obstetrics and Gynecology

## 2022-04-16 ENCOUNTER — Ambulatory Visit: Payer: Self-pay | Admitting: Hematology and Oncology

## 2022-04-16 VITALS — BP 129/65 | Wt 151.0 lb

## 2022-04-16 DIAGNOSIS — Z1211 Encounter for screening for malignant neoplasm of colon: Secondary | ICD-10-CM

## 2022-04-16 DIAGNOSIS — Z1231 Encounter for screening mammogram for malignant neoplasm of breast: Secondary | ICD-10-CM

## 2022-04-16 NOTE — Progress Notes (Signed)
[name] is a 47 y.o. female who presents to [facility] clinic today with no complaints.    Pap Smear: Pap not smear completed today. Last Pap smear was 2023 and was normal. Per patient has no history of an abnormal Pap smear. Last Pap smear result is available in Epic.   Physical exam: Breasts Breasts symmetrical. No skin abnormalities bilateral breasts. No nipple retraction bilateral breasts. No nipple discharge bilateral breasts. No lymphadenopathy. No lumps palpated bilateral breasts.  MM 3D SCREEN BREAST BILATERAL  Result Date: 04/11/2021 CLINICAL DATA:  Screening. EXAM: DIGITAL SCREENING BILATERAL MAMMOGRAM WITH TOMOSYNTHESIS AND CAD TECHNIQUE: Bilateral screening digital craniocaudal and mediolateral oblique mammograms were obtained. Bilateral screening digital breast tomosynthesis was performed. The images were evaluated with computer-aided detection. COMPARISON:  Previous exam(s). ACR Breast Density Category d: The breast tissue is extremely dense, which lowers the sensitivity of mammography FINDINGS: There are no findings suspicious for malignancy. IMPRESSION: No mammographic evidence of malignancy. A result letter of this screening mammogram will be mailed directly to the patient. RECOMMENDATION: Screening mammogram in one year. (Code:SM-B-01Y) BI-RADS CATEGORY  1: Negative. Electronically Signed   By: Abelardo Diesel M.D.   On: 04/11/2021 09:00   MS DIGITAL SCREENING TOMO BILATERAL  Result Date: 01/12/2020 CLINICAL DATA:  Screening. EXAM: DIGITAL SCREENING BILATERAL MAMMOGRAM WITH TOMO AND CAD COMPARISON:  Previous exam(s). ACR Breast Density Category c: The breast tissue is heterogeneously dense, which may obscure small masses. FINDINGS: There are no findings suspicious for malignancy. Images were processed with CAD. IMPRESSION: No mammographic evidence of malignancy. A result letter of this screening mammogram will be mailed directly to the patient. RECOMMENDATION: Screening mammogram  in one year. (Code:SM-B-01Y) BI-RADS CATEGORY  1: Negative. Electronically Signed   By: Franki Cabot M.D.   On: 01/12/2020 16:13   MS DIGITAL DIAG TOMO BILAT  Result Date: 01/04/2019 CLINICAL DATA:  47 year old female for 2 year follow-up of RIGHT breast mass and for annual bilateral mammogram. EXAM: DIGITAL DIAGNOSTIC BILATERAL MAMMOGRAM WITH CAD AND TOMO ULTRASOUND RIGHT BREAST COMPARISON:  Previous exam(s). ACR Breast Density Category d: The breast tissue is extremely dense, which lowers the sensitivity of mammography. FINDINGS: 2D/3D full field views of both breasts again demonstrate circumscribed oval mass within the UPPER-OUTER RIGHT breast. No new mass, distortion or worrisome calcifications noted. Mammographic images were processed with CAD. Targeted ultrasound is performed, showing a 0.9 x 0.6 x 1.3 cm circumscribed oval hypoechoic parallel mass at the 10 o'clock position of the RIGHT breast 3 cm from the nipple, previously 1.3 x 0.8 x 1.6 cm. IMPRESSION: 1. Decreasing size of UPPER-OUTER RIGHT breast mass, compatible with a benign mass/fibroadenoma. 2. No mammographic evidence of breast malignancy. RECOMMENDATION: Bilateral screening mammogram in 1 year. I have discussed the findings and recommendations with the patient. If applicable, a reminder letter will be sent to the patient regarding the next appointment. BI-RADS CATEGORY  2: Benign. Electronically Signed   By: Margarette Canada M.D.   On: 01/04/2019 09:51   MM DIAG BREAST TOMO BILATERAL  Result Date: 12/21/2017 CLINICAL DATA:  One year follow-up for probably benign RIGHT breast mass. EXAM: DIGITAL DIAGNOSTIC BILATERAL MAMMOGRAM WITH CAD AND TOMO ULTRASOUND RIGHT BREAST COMPARISON:  06/02/2017 and earlier ACR Breast Density Category c: The breast tissue is heterogeneously dense, which may obscure small masses. FINDINGS: A circumscribed oval mass is identified in the UPPER-OUTER QUADRANT of the RIGHT breast, stable mammographically since the  previous exams. No new mass, distortion, or suspicious microcalcifications are  identified in the breast. Mammographic images were processed with CAD. Targeted ultrasound is performed, showing a circumscribed oval hypoechoic parallel mass in the 10 o'clock location of the RIGHT breast 3 centimeters from the nipple which measures 1.6 x 1.3 x 0.8 centimeters. An adjacent benign cyst is again noted. Previously, mass measured 1.5 x 1.6 x 1.2 centimeters. IMPRESSION: Stable to slightly smaller probable fibroadenoma in the 10 o'clock location of the RIGHT breast. No mammographic or ultrasound evidence for malignancy. RECOMMENDATION: Bilateral diagnostic mammogram and RIGHT breast ultrasound recommended in 1 year. I have discussed the findings and recommendations with the patient. Results were also provided in writing at the conclusion of the visit. If applicable, a reminder letter will be sent to the patient regarding the next appointment. BI-RADS CATEGORY  3: Probably benign. Electronically Signed   By: Nolon Nations M.D.   On: 12/21/2017 15:51         Pelvic/Bimanual Pap is not indicated today    Smoking History: Patient has never smoked and was not referred to quit line.    Patient Navigation: Patient education provided. Access to services provided for patient through [place] interpreter provided. No transportation provided   Colorectal Cancer Screening: Per patient has never had colonoscopy completed No complaints today. FIT test given.   Breast and Cervical Cancer Risk Assessment: Patient does not have family history of breast cancer, known genetic mutations, or radiation treatment to the chest before age 20. Patient does not have history of cervical dysplasia, immunocompromised, or DES exposure in-utero.  Risk Scores as of 04/16/2022     [place]           5-year 0.69 %   Lifetime 7.79 %   This patient is [place] but has no documented birth country, so the [place] used data from [place] patients to calculate their risk score. Document a birth country in the Demographics activity for a more accurate score.         Last calculated by Claretha Cooper, CMA on 04/16/2022 at  9:17 AM        A: [facility] exam without pap smear No complaints with benign exam.   P: Referred patient to the [facility] for a screening mammogram. Appointment scheduled 04/16/22.  [name], NP 04/16/2022 9:35 AM

## 2022-04-16 NOTE — Patient Instructions (Signed)
[place] about self breast awareness and gave educational materials to take home. Patient did not need a Pap smear today due to last Pap smear was in 2023 per patient. Let her know [facility] will cover Pap smears every 5 years unless has a history of abnormal Pap smears. Referred patient to the [facility] for screening mammogram. Appointment scheduled for 04/16/2022. Patient aware of appointment and will be there. Let patient know will follow up with her within the next couple weeks with results. Chelsea Yates verbalized understanding.  [name], NP 9:45 AM

## 2022-04-26 LAB — FECAL OCCULT BLOOD, IMMUNOCHEMICAL: Fecal Occult Bld: NEGATIVE

## 2022-05-27 ENCOUNTER — Inpatient Hospital Stay: Payer: Self-pay | Attending: Obstetrics and Gynecology | Admitting: *Deleted

## 2022-05-27 ENCOUNTER — Other Ambulatory Visit: Payer: Self-pay

## 2022-05-27 VITALS — BP 122/80 | Ht 59.0 in | Wt 149.7 lb

## 2022-05-27 DIAGNOSIS — Z0000 Encounter for general adult medical examination without abnormal findings: Secondary | ICD-10-CM

## 2022-05-27 NOTE — Progress Notes (Signed)
[facility] initial screening   Interpreter- Natale Lay, [employer]   Clinical Measurement: There were no vitals filed for this visit. Fasting Labs Drawn Today, will review with patient when they result.   Medical History: Patient states that she has high cholesterol, does not have high blood pressure and she does not have diabetes.  Medications: Patient states that she does take medication to lower cholesterol, blood pressure or blood sugar.  Patient does not take an aspirin a day to help prevent a heart attack or stroke. During the past 7 days patient has taken prescribed medication to lower cholesterol on 7 days.   Blood pressure, self measurement: Patient states that she does not measure blood pressure from home. She checks her blood pressure N/A. She shares her readings with a health care provider: N/A.   Nutrition: Patient states that on average she eats 2 cups of fruit and 0 cups of vegetables per day. Patient states that she does eat fish at least 2 times per week. Patient eats more than half servings of whole grains. Patient drinks less than 36 ounces of beverages with added sugar weekly: yes. Patient is currently watching sodium or salt intake: yes. In the past 7 days patient has consumed drinks containing alcohol on 0 days. On a day that patient consumes drinks containing alcohol on average 0 drinks are consumed.      Physical activity: Patient states that she gets 60 minutes of moderate and 0 minutes of vigorous physical activity each week.  Smoking status: Patient states that she has has never smoked .   Quality of life: Over the past 2 weeks patient states that she had little interest or pleasure in doing things: not at all. She has been feeling down, depressed or hopeless:not at all.   Social Determinants of Health Assessment:   Computer Use: During the last 12 months patient states that she has used any of the following: desktop/laptop, smart phone or tablet/other portable  wireless computer: yes.   Internet Use: During the last 12 months, did you or any member of your household have access to the internet: Yes, by paying a cell phone company or internet service provider.   Food Insecurities: During the last 12 months, where there any times when you were worried that you would run out of food because of a lack of money or other resources: No.   Transportation Barriers: During the last 12 months, have you missed a doctor's appointment because of transportation problems: No.   Childcare Barriers: If you are currently using childcare services, please identify  the type of services you use. (If not using childcare services, please select "Not applicable"): not applicable. During the last 12 months, have you had any barriers to childcare services such as: not applicable.   Housing: What is your housing situation today: I have housing.   Intimate Partner Violence: During the last 12 months, how often did your partner physically hurt you: never. During the last 12 months, how often did your partner insult you or talk down to you: never.  Medication Adherence: During the last 12 months, did you ever forget to take your medicine: Yes. During the last 12 months, were you careless ar times about taking your medicine: Yes. During the last 12 months, when you felt better did you sometimes stop taking your medication: Yes. During the last 12 months, sometimes if you felt worse when you took your medicine did you stop taking it: No.   Risk reduction and counseling:  Health Coaching: Spoke with patient about the daily recommendations for fruit and exercise. Showed patient what a serving size looks like. Spoke with patient about trying to add in vegetables into her daily diet. Patient consumes a variety of whole grains (whole wheat bread, oatmeal, brown rice and whole wheat pasta. Patient makes her own home made fruit juice but does add sugar to it. Encouraged patient to try  eliminating the sugar that she adds to the juice and using the natural sweetness from the fruits that she juices. Explained to patient that the daily recommendation for exercise is 20-30 minutes. Patient stated that over the past year there were times that she forgot to take her cholesterol medication. Offered to refer patient to resources for medication adherence, patient declined. Did speak with the patient about the importance of taking her medication daily and at the same time every day. Gave patient the suggestion about setting a daily alarm to help her to remember to take her medication. Patient did state that she has been taking her medication regularly recently and has not forgotten any doses.   Goal: Patient did not set a SMART GOAL. Patient did say that she wants to keep cholesterol levels within normal range. We talked about achieving this through maintaining a heart Healthy diet (low-fat, reduce red meat intake, increase fruit and vegetable intake, increase whole grains and increase lean proteins).    Navigation:  I will notify patient of lab results.  Patient is aware of 2 more health coaching sessions and a follow up.  Time: 25 minutes

## 2022-05-28 LAB — HEMOGLOBIN A1C
Est. average glucose Bld gHb Est-mCnc: 103 mg/dL
Hgb A1c MFr Bld: 5.2 % (ref 4.8–5.6)

## 2022-05-28 LAB — LIPID PANEL
Chol/HDL Ratio: 3.4 ratio (ref 0.0–4.4)
Cholesterol, Total: 212 mg/dL — ABNORMAL HIGH (ref 100–199)
HDL: 62 mg/dL (ref >39)
LDL Chol Calc (NIH): 121 mg/dL — ABNORMAL HIGH (ref 0–99)
Triglycerides: 164 mg/dL — ABNORMAL HIGH (ref 0–149)
VLDL Cholesterol Cal: 29 mg/dL (ref 5–40)

## 2022-05-28 LAB — GLUCOSE, RANDOM: Glucose: 94 mg/dL (ref 70–99)

## 2022-06-02 ENCOUNTER — Telehealth: Payer: Self-pay

## 2022-06-02 NOTE — Telephone Encounter (Signed)
Health coaching 2   interpreter- Natale Lay, [employer]   Labs- 212 cholesterol, 121 LDL cholesterol, 164 triglycerides, 62 HDL cholesterol, 5.2 hemoglobin A1C, 94 mean plasma glucose. Patient understands and is aware of her lab results.   Goals-1. Reduce the amount of fried and fatty foods consumed. Try to grill, bake, broil, or sautee foods. 2. Reduce the amount of red meat consumed. Try to substitute for lean proteins like chicken or fish.  3. Reduce the amount of whole fat dairy products consumed. Substitute for low fat or reduced fat options.  4. Daily exercise for 20-30 minutes.   Navigation:  Patient is aware of 1 more health coaching sessions and a follow up. Will refer patient to Internal Medicine for follow-up for elevated labs.  Time- 12 minutes

## 2022-06-17 ENCOUNTER — Ambulatory Visit: Payer: No Typology Code available for payment source | Admitting: Student

## 2022-06-18 ENCOUNTER — Other Ambulatory Visit: Payer: Self-pay

## 2022-06-18 ENCOUNTER — Encounter: Payer: Self-pay | Admitting: Student

## 2022-06-18 ENCOUNTER — Ambulatory Visit (INDEPENDENT_AMBULATORY_CARE_PROVIDER_SITE_OTHER): Payer: Self-pay | Admitting: Student

## 2022-06-18 VITALS — BP 129/58 | HR 68 | Temp 98.2°F | Ht 59.0 in | Wt 153.3 lb

## 2022-06-18 DIAGNOSIS — L918 Other hypertrophic disorders of the skin: Secondary | ICD-10-CM

## 2022-06-18 DIAGNOSIS — M5489 Other dorsalgia: Secondary | ICD-10-CM

## 2022-06-18 DIAGNOSIS — G8929 Other chronic pain: Secondary | ICD-10-CM

## 2022-06-18 DIAGNOSIS — M549 Dorsalgia, unspecified: Secondary | ICD-10-CM | POA: Insufficient documentation

## 2022-06-18 DIAGNOSIS — R7401 Elevation of levels of liver transaminase levels: Secondary | ICD-10-CM

## 2022-06-18 DIAGNOSIS — E785 Hyperlipidemia, unspecified: Secondary | ICD-10-CM

## 2022-06-18 MED ORDER — ROSUVASTATIN CALCIUM 20 MG PO TABS: 20.0000 mg | ORAL_TABLET | Freq: Every day | ORAL | 11 refills | Status: DC

## 2022-06-18 MED ORDER — DICLOFENAC SODIUM 1 % EX GEL: 2.0000 g | Freq: Four times a day (QID) | CUTANEOUS | 2 refills | Status: DC

## 2022-06-18 MED ORDER — OMEPRAZOLE 40 MG PO CPDR: 40.0000 mg | DELAYED_RELEASE_CAPSULE | Freq: Every day | ORAL | 11 refills | Status: DC

## 2022-06-18 NOTE — Assessment & Plan Note (Addendum)
Patient presents for hyperlipidemia follow-up.  Patient recently had cholesterol levels checked on 05/27/2022 showing total cholesterol 212, triglycerides 164, LDL 121.  Patient reports she eats fried foods, and also eats steak.  She states she exercises about 2-3 times a week.  She states she walks 30 to 60 minutes.  She states she has been compliant with her rosuvastatin with no concerns of myalgias.  Of note, patient has been on rosuvastatin for about 2 years now, given that patient had LDL that was borderline to 189.  Patient reports compliance.  LDL is not at goal.  Given patient does have elevated transaminases, will recheck transaminases today, and if normal, will increase to 40.  Plan: -CMP pending -Continue rosuvastatin 20 mg daily, if liver enzymes come back to normal, will increase to 40 -Follow-up lipid panel in about 6 months

## 2022-06-18 NOTE — Assessment & Plan Note (Addendum)
Back in June 2022, patient had elevated AST of 55 and ALT of 69.  Patient did have ultrasound back in 2021 which did not show any evidence of cholelithiasis.  Patient was to get further lab work after obtaining insurance, but patient has not obtained insurance yet.  Patient is agreeable to getting CMP checked today.  Etiology can be fatty liver disease, autoimmune hepatitis, viral hepatitis, or statin induced.  Plan: -Recheck CMP today  Addendum: -Resolved. No acute concerns.

## 2022-06-18 NOTE — Patient Instructions (Signed)
Gracias por venir a la oficina para ser atendido hoy. Esto es lo que discutimos.  1.En cuanto a su colesterol alto, evite los alimentos con alto contenido de [place], como las carnes rojas. Intente comer carnes ms magras. Contine haciendo ejercicio. Si los nmeros de su hgado vuelven a la normalidad, comenzaremos con una dosis ms alta de su medicamento.  2. Con respecto a sus cifras elevadas de hgado, voy a verificar sus cifras nuevamente para ver si han vuelto a la normalidad. Te llamar con [employer].  3. Vuelva entre 6 meses y un ao.  4. Si sus marcas en la piel siguen creciendo rpidamente y comienzan a Geophysicist/field seismologist o comienzan a enrojecerse, regrese.  5. Te he escrito una crema para que te la pongas en la espalda cuando tengas dolor.  Si tiene alguna otra inquietud entre ahora y la prxima cita, no dude en llamar al centro de medicina interna al [phone].

## 2022-06-18 NOTE — Progress Notes (Addendum)
CC: [facility] Lab follow up   HPI:  Chelsea Yates is a 47 y.o. female with a past medical history of hyperlipidemia who presents for advice on the labs follow-up.  Please see assessment and plan for full HPI.  Past Medical History:  Diagnosis Date   Abdominal pain, chronic, right lower quadrant 10/05/2019   Blood transfusion without reported diagnosis    Fibroids    GERD (gastroesophageal reflux disease)    no meds   Hyperlipidemia    Postpartum care following vaginal delivery 09/30/2010   Trapezius muscle strain 07/10/2020     Current Outpatient Medications:    diclofenac Sodium (VOLTAREN) 1 % GEL, Apply 2 g topically 4 (four) times daily. Apply to back, Disp: 50 g, Rfl: 2   MAGNESIUM GLYCINATE PO, Take 240 mg by mouth daily., Disp: , Rfl:    omeprazole (PRILOSEC) 40 MG capsule, Take 1 capsule (40 mg total) by mouth daily., Disp: 30 capsule, Rfl: 11   rosuvastatin (CRESTOR) 20 MG tablet, Take 1 tablet (20 mg total) by mouth daily., Disp: 30 tablet, Rfl: 11  Review of Systems:    MSK: Patient endorses right-sided back pain  Physical Exam:  Vitals:   06/18/22 1537  BP: (!) 129/58  Pulse: 68  Temp: 98.2 F (36.8 C)  TempSrc: Oral  SpO2: 100%  Weight: 153 lb 4.8 oz (69.5 kg)  Height: 4\' 11"  (1.499 m)    General: Patient is sitting comfortably in the room  Head: Normocephalic, atraumatic  Cardio: Regular rate and rhythm, no murmurs, rubs or gallops. 2+ pulses to bilateral upper and lower extremities  Pulmonary: Clear to ausculation bilaterally with no rales, rhonchi, and crackles  Abdomen: Soft, nontender with normoactive bowel sounds with no rebound or guarding  Back: No midline tenderness, no step off or deformities noted. No paraspinal muscle tenderness. Skin: Skin tag noted to left third and fourth rib space with no signs of erythema, drainage, underlying infection.  Another skin tag noted to right third and fourth rib space with no signs of erythema, drainage,  or underlying infection.   Assessment & Plan:   Hyperlipidemia Patient presents for hyperlipidemia follow-up.  Patient recently had cholesterol levels checked on 05/27/2022 showing total cholesterol 212, triglycerides 164, LDL 121.  Patient reports she eats fried foods, and also eats steak.  She states she exercises about 2-3 times a week.  She states she walks 30 to 60 minutes.  She states she has been compliant with her rosuvastatin with no concerns of myalgias.  Of note, patient has been on rosuvastatin for about 2 years now, given that patient had LDL that was borderline to 189.  Patient reports compliance.  LDL is not at goal.  Given patient does have elevated transaminases, will recheck transaminases today, and if normal, will increase to 40.  Plan: -CMP pending -Continue rosuvastatin 20 mg daily, if liver enzymes come back to normal, will increase to 40 -Follow-up lipid panel in about 6 months  Skin tag Patient is concerned about some growths noted to her left and right side over the past 2 months.  She states that they are not painful.  She denies any drainage or blood.  On exam, there is 1 skin tag noted to her left side near her third and fourth rib space as well as another skin tag noted on the right side near the third and fourth rib space.  On the right side, the skin tag is palpable with no tenderness.  No obvious erythema.  It is the color of her underlying skin.  On the left side, the skin tag is hanging on a stalk, and is slightly darker in color.  Does not seem asymmetric.  Is not painful.  It is not bleeding.  I do not think these are malignant lesions, and I think this is just an overgrowth.  Patient offered to remove at next visit.  Plan: -Gave the patient option to discuss at next visit if she wants skin tag removed or not  Back pain Patient states she has been having intermittent back pain over the past few months.  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  She denies any injury to the area.  She denies any falls.  She denies any bowel or bladder incontinence or any radiation to her legs.  She describes this pain to be a burning sensation noted in her lumbar region on her right side that stretches all the way to her right trapezius muscle.  She states she tried Tylenol with relief.  On exam, she has no paraspinal muscle tenderness or any midline spinal tenderness or any step-off or deformity appreciated.  Patient has full range of motion.  Likely this is some muscle strains from working.  I do not think this is anything that requires imaging at this time.  The patient does develop incontinence, or pain becomes worse, will obtain imaging.  Plan: -Supportive care with Tylenol and Voltaren gel  Elevated transaminase level Back in June 2022, patient had elevated AST of 55 and ALT of 69.  Patient did have ultrasound back in 2021 which did not show any evidence of cholelithiasis.  Patient was to get further lab work after obtaining insurance, but patient has not obtained insurance yet.  Patient is agreeable to getting CMP checked today.  Etiology can be fatty liver disease, autoimmune hepatitis, viral hepatitis, or statin induced.  Plan: -Recheck CMP today  Addendum: -Resolved. No acute concerns.   Patient discussed with Dr. Oren Bracket, DO PGY-1 Internal Medicine Resident  Pager: [phone]

## 2022-06-18 NOTE — Assessment & Plan Note (Signed)
Patient is concerned about some growths noted to her left and right side over the past 2 months.  She states that they are not painful.  She denies any drainage or blood.  On exam, there is 1 skin tag noted to her left side near her third and fourth rib space as well as another skin tag noted on the right side near the third and fourth rib space.  On the right side, the skin tag is palpable with no tenderness.  No obvious erythema.  It is the color of her underlying skin.  On the left side, the skin tag is hanging on a stalk, and is slightly darker in color.  Does not seem asymmetric.  Is not painful.  It is not bleeding.  I do not think these are malignant lesions, and I think this is just an overgrowth.  Patient offered to remove at next visit.  Plan: -Gave the patient option to discuss at next visit if she wants skin tag removed or not

## 2022-06-18 NOTE — Assessment & Plan Note (Signed)
Patient states she has been having intermittent back pain over the past few months.  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  She denies any injury to the area.  She denies any falls.  She denies any bowel or bladder incontinence or any radiation to her legs.  She describes this pain to be a burning sensation noted in her lumbar region on her right side that stretches all the way to her right trapezius muscle.  She states she tried Tylenol with relief.  On exam, she has no paraspinal muscle tenderness or any midline spinal tenderness or any step-off or deformity appreciated.  Patient has full range of motion.  Likely this is some muscle strains from working.  I do not think this is anything that requires imaging at this time.  The patient does develop incontinence, or pain becomes worse, will obtain imaging.  Plan: -Supportive care with Tylenol and Voltaren gel

## 2022-06-19 NOTE — Progress Notes (Signed)
Internal Medicine Clinic Attending  Case discussed with Dr. Patel  At the time of the visit.  We reviewed the resident's history and exam and pertinent patient test results.  I agree with the assessment, diagnosis, and plan of care documented in the resident's note.  

## 2022-06-20 LAB — CMP14 + ANION GAP
ALT: 17 IU/L (ref 0–32)
AST: 19 IU/L (ref 0–40)
Albumin/Globulin Ratio: 2 (ref 1.2–2.2)
Albumin: 4.7 g/dL (ref 3.9–4.9)
Alkaline Phosphatase: 58 IU/L (ref 44–121)
Anion Gap: 17 mmol/L (ref 10.0–18.0)
BUN/Creatinine Ratio: 21 (ref 9–23)
BUN: 16 mg/dL (ref 6–24)
Bilirubin Total: <0.2 mg/dL (ref 0.0–1.2)
CO2: 17 mmol/L — ABNORMAL LOW (ref 20–29)
Calcium: 9.7 mg/dL (ref 8.7–10.2)
Chloride: 104 mmol/L (ref 96–106)
Creatinine, Ser: 0.75 mg/dL (ref 0.57–1.00)
Globulin, Total: 2.4 g/dL (ref 1.5–4.5)
Glucose: 97 mg/dL (ref 70–99)
Potassium: 4 mmol/L (ref 3.5–5.2)
Sodium: 138 mmol/L (ref 134–144)
Total Protein: 7.1 g/dL (ref 6.0–8.5)
eGFR: 99 mL/min/{1.73_m2} (ref >59)

## 2022-06-22 NOTE — Progress Notes (Signed)
Internal Medicine Clinic Attending  Case discussed with Dr. Patel  At the time of the visit.  We reviewed the resident's history and exam and pertinent patient test results.  I agree with the assessment, diagnosis, and plan of care documented in the resident's note.  

## 2023-02-25 ENCOUNTER — Telehealth: Payer: Self-pay

## 2023-02-25 NOTE — Telephone Encounter (Signed)
Health Coaching 3  interpreter- Natale Lay, [employer]   Goals- Patient states that she has been trying to practice the heart healthy diet that we spoke about during her last health coaching session. She wants her labs to be with-in the normal range when we do her re-screening. She has been trying to watch the amount of fried and fatty foods that she consumes. She has been trying to consume fruits and vegetables daily. She still does not consume vegetables daily. Encouraged her to try and add at least a serving of vegetables into her daily diet.   New goal-   Barrier to reaching goal-    Strategies to overcome-    Navigation:  Patient is aware of  a follow up session. Patient is scheduled for FU on 03/24/23.   Time- 10 minutes

## 2023-03-23 NOTE — Progress Notes (Unsigned)
Wisewoman follow up   Interpreter: Natale Lay, Haroldine Laws  Clinical Measurement:   Vitals:   03/24/23 1112 03/24/23 1249  BP: (!) 140/80 138/80      Medical History: Patient states that she has high cholesterol, does not have high blood pressure and she does not have diabetes. Patient states that she does not have history of gestational hypertension, does not have history of pre-eclampsia/eclampsia and she does not have history of gestational diabetes.     Medications: Patient states that she does take medication to lower cholesterol, blood pressure and blood sugar.  Patient does not take an aspirin a day to help prevent a heart attack or stroke. During the past 7 days patient has taken prescribed medication to lower cholesterol on 7 days.   Blood pressure, self measurement: Patient states that she does not measure blood pressure from home. She checks her blood pressure N/A. She shares her readings with a health care provider: N/A.   Nutrition: Patient states that on average she eats 2 cups of fruit and 2 cups of vegetables per day. Patient states that she does eat fish at least 2 times per week. Patient eats more than half servings of whole grains. Patient drinks less than 36 ounces of beverages with added sugar weekly: yes. Patient is currently watching sodium or salt intake: yes. In the past 7 days patient has had 0 drinks containing alcohol. On average patient drinks 0 drinks containing alcohol per day.      Physical activity: Patient states that she gets 0 minutes of moderate and 0 minutes of vigorous physical activity each week.  Smoking status: Patient states that she has has never smoked .   Quality of life: Over the past 2 weeks patient states that she had little interest or pleasure in doing things: not at all. She has been feeling down, depressed or hopeless:not at all.   Social Determinants of Health Assessment:   Computer Use: During the last 12 months patient states that she  has used any of the following: desktop/laptop, smart phone or tablet/other portable wireless computer: yes.   Internet Use: During the last 12 months, did you or any member of your household have access to the internet: Yes, by paying a cell phone company or internet service provider.   Food Insecurities: During the last 12 months, where there any times when you were worried that you would run out of food because of a lack of money or other resources: No.   Transportation Barriers: During the last 12 months, have you missed a doctor's appointment because of transportation problems: No.   Childcare Barriers: If you are currently using childcare services, please identify  the type of services you use. (If not using childcare services, please select "Not applicable"): not applicable. During the last 12 months, have you had any barriers to childcare services such as: not applicable.   Housing: What is your housing situation today: I have housing.   Intimate Partner Violence: During the last 12 months, how often did your partner physically hurt you: never. During the last 12 months, how often did your partner insult you or talk down to you: never.  Medication Adherence: During the last 12 months, did you ever forget to take your medicine: No. During the last 12 months, were you careless ar times about taking your medicine: No. During the last 12 months, when you felt better did you sometimes stop taking your medication: No. During the last 12 months, sometimes if you  felt worse when you took your medicine did you stop taking it: No.    Risk reduction and counseling:   Health Coaching: Patient has been practicing a heart healthy diet. She has been compliant with her statin. Patient has not been exercising recently. Encouraged patient to try and get her body moving some each day if possible.    Navigation: This was the  follow up session for this patient, I will check up on her progress in the coming  months.  Time: 20 minutes

## 2023-03-24 ENCOUNTER — Inpatient Hospital Stay: Payer: No Typology Code available for payment source | Attending: Obstetrics and Gynecology | Admitting: *Deleted

## 2023-03-24 VITALS — BP 138/80 | Ht 59.0 in | Wt 144.0 lb

## 2023-03-24 DIAGNOSIS — Z0000 Encounter for general adult medical examination without abnormal findings: Secondary | ICD-10-CM

## 2023-05-06 ENCOUNTER — Encounter: Payer: Self-pay | Admitting: Obstetrics and Gynecology

## 2023-05-06 ENCOUNTER — Ambulatory Visit
Admission: RE | Admit: 2023-05-06 | Discharge: 2023-05-06 | Disposition: A | Discharge status: Home / Self Care | Payer: No Typology Code available for payment source | Source: Ambulatory Visit | Attending: Obstetrics and Gynecology | Admitting: Obstetrics and Gynecology

## 2023-05-06 ENCOUNTER — Other Ambulatory Visit: Payer: Self-pay

## 2023-05-06 ENCOUNTER — Ambulatory Visit: Payer: Self-pay | Admitting: Hematology and Oncology

## 2023-05-06 ENCOUNTER — Other Ambulatory Visit: Payer: Self-pay | Admitting: Obstetrics and Gynecology

## 2023-05-06 VITALS — BP 127/66 | Wt 142.0 lb

## 2023-05-06 DIAGNOSIS — Z1231 Encounter for screening mammogram for malignant neoplasm of breast: Secondary | ICD-10-CM

## 2023-05-06 DIAGNOSIS — Z1211 Encounter for screening for malignant neoplasm of colon: Secondary | ICD-10-CM

## 2023-05-06 NOTE — Progress Notes (Signed)
 Ms. Reha Martinovich is a 48 y.o. female who presents to [facility] clinic today with no complaints.    Pap Smear: Pap not smear completed today. Last Pap smear was 04/22/2021 and was normal. Per patient has no history of an abnormal Pap smear. Last Pap smear result is available in Epic.   Physical exam: Breasts Breasts symmetrical. No skin abnormalities bilateral breasts. No nipple retraction bilateral breasts. No nipple discharge bilateral breasts. No lymphadenopathy. No lumps palpated bilateral breasts.       Pelvic/Bimanual Pap is not indicated today    Smoking History: Patient has never smoked and was not referred to quit line.    Patient Navigation: Patient education provided. Access to services provided for patient through [facility] program. Natale Lay interpreter provided. No transportation provided   Colorectal Cancer Screening: Per patient has never had colonoscopy completed No complaints today.    Breast and Cervical Cancer Risk Assessment: Patient does not have family history of breast cancer, known genetic mutations, or radiation treatment to the chest before age 24. Patient does not have history of cervical dysplasia, immunocompromised, or DES exposure in-utero.  Risk Scores as of Encounter on 05/06/2023     Dondra Spry as of 04/16/2022           5-year 0.69%   Lifetime 7.79%   This patient is [place] but has no documented birth country, so the [place] model used data from [place] patients to calculate their risk score. Document a birth country in the Demographics activity for a more accurate score.         Last calculated by Caprice Red, CMA on 04/16/2022 at  9:17 AM        A: [facility] exam without pap smear No complaints with benign exam.   P: Referred patient to the [facility] for a screening mammogram. Appointment scheduled 05/06/2023.  Ilda Basset A, NP 05/06/2023 10:03 AM

## 2023-05-06 NOTE — Patient Instructions (Signed)
 Taught Chelsea Yates about self breast awareness and gave educational materials to take home. Patient did not need a Pap smear today due to last Pap smear was in 04/22/2021 per patient. Told patient about free cervical cancer screenings to receive a Pap smear if would like one next year. Let her know [facility] will cover Pap smears every 5 years unless has a history of abnormal Pap smears. Referred patient to the [facility] for screening mammogram. Appointment scheduled for 05/06/2023. Patient aware of appointment and will be there. Let patient know will follow up with her within the next couple weeks with results. Chelsea Yates verbalized understanding.  Pascal Lux, NP 10:04 AM

## 2023-05-15 LAB — FECAL OCCULT BLOOD, IMMUNOCHEMICAL: Fecal Occult Bld: NEGATIVE

## 2023-06-08 NOTE — Progress Notes (Unsigned)
 Wisewoman initial screening   Interpreter- Natale Lay, [place]   Clinical Measurement:  Vitals:   03/26/23 0838 03/26/23 0854  BP: 125/75 137/87   Fasting Labs Drawn Today, will review with patient when they result.   Medical History: Patient states that she does not know if she has high cholesterol, does not have high blood pressure and she does not have diabetes. Patient states that she does not have history of gestational hypertension, does not have history of pre-eclampsia/eclampsia and she has history of gestational diabetes.    Medications: Patient states that she does not take medication to lower cholesterol, blood pressure or blood sugar.  Patient does not take an aspirin a day to help prevent a heart attack or stroke.    Blood pressure, self measurement: Patient states that she does measure blood pressure from home. She checks her blood pressure monthly. She shares her readings with a health care provider: no.   Nutrition: Patient states that on average she eats 2 cups of fruit and 2 cups of vegetables per day. Patient states that she does not eat fish at least 2 times per week. Patient eats about half servings of whole grains. Patient drinks less than 36 ounces of beverages with added sugar weekly: no. Patient is currently watching sodium or salt intake: yes. In the past 7 days patient has consumed drinks containing alcohol on 0 days. On a day that patient consumes drinks containing alcohol on average 1 drinks are consumed.      Physical activity: Patient states that she gets 0 minutes of moderate and 0 minutes of vigorous physical activity each week.  Smoking status: Patient states that she has has never smoked .   Quality of life: Over the past 2 weeks patient states that she had little interest or pleasure in doing things: not at all. She has been feeling down, depressed or hopeless:not at all.   Social Determinants of Health Assessment:   Computer Use: During the last 12  months patient states that she has used any of the following: desktop/laptop, smart phone or tablet/other portable wireless computer: yes.   Internet Use: During the last 12 months, did you or any member of your household have access to the internet: Yes, by paying a cell phone company or internet service provider.   Food Insecurities: During the last 12 months, where there any times when you were worried that you would run out of food because of a lack of money or other resources: No.   Transportation Barriers: During the last 12 months, have you missed a doctor's appointment because of transportation problems: No.   Childcare Barriers: If you are currently using childcare services, please identify  the type of services you use. (If not using childcare services, please select "Not applicable"): not applicable. During the last 12 months, have you had any barriers to childcare services such as: not applicable.   Housing: What is your housing situation today: I have housing.   Intimate Partner Violence: During the last 12 months, how often did your partner physically hurt you: never. During the last 12 months, how often did your partner insult you or talk down to you: never.  Medication Adherence: During the last 12 months, did you ever forget to take your medicine: not applicable. During the last 12 months, were you careless ar times about taking your medicine: not applicable. During the last 12 months, when you felt better did you sometimes stop taking your medication: not applicable. During the  last 12 months, sometimes if you felt worse when you took your medicine did you stop taking it: not applicable.   Risk reduction and counseling:   Health Coaching: Spoke with patient about the daily recommendations for fruits and vegetables. Showed patient what a serving size would look like. Patient does not consume fish often. Gave examples of heart healthy fish that she can try adding into weekly diet  (salmon, tuna, mackerel, sardines, sea bass or trout). Patient consumes a variety of whole grains. Patient has not been exercising recently. Encouraged patient to start getting her body moving some each day with a goal of 20-30 minutes daily.  Goal: Patient will decrease the amount of soda consumed daily. Patient currently consumes 1 12 oz can of soda daily. Over the next 2 weeks patient will switch to one mini can (7.5 oz) daily.    Navigation:  I will notify patient of lab results.  Patient is aware of 2 more health coaching sessions and a follow up.  Time: 25 minutes

## 2023-06-09 ENCOUNTER — Other Ambulatory Visit: Payer: Self-pay

## 2023-06-09 ENCOUNTER — Inpatient Hospital Stay: Payer: No Typology Code available for payment source | Attending: Obstetrics and Gynecology

## 2023-06-09 VITALS — BP 127/68 | Ht 59.0 in | Wt 143.0 lb

## 2023-06-09 DIAGNOSIS — Z0000 Encounter for general adult medical examination without abnormal findings: Secondary | ICD-10-CM

## 2023-06-10 LAB — LIPID PANEL
Chol/HDL Ratio: 3.1 ratio (ref 0.0–4.4)
Cholesterol, Total: 214 mg/dL — ABNORMAL HIGH (ref 100–199)
HDL: 68 mg/dL (ref >39)
LDL Chol Calc (NIH): 125 mg/dL — ABNORMAL HIGH (ref 0–99)
Triglycerides: 118 mg/dL (ref 0–149)
VLDL Cholesterol Cal: 21 mg/dL (ref 5–40)

## 2023-06-10 LAB — HEMOGLOBIN A1C
Est. average glucose Bld gHb Est-mCnc: 108 mg/dL
Hgb A1c MFr Bld: 5.4 % (ref 4.8–5.6)

## 2023-06-10 LAB — GLUCOSE, RANDOM: Glucose: 94 mg/dL (ref 70–99)

## 2023-06-16 ENCOUNTER — Telehealth: Payer: Self-pay

## 2023-06-16 NOTE — Telephone Encounter (Signed)
 Health coaching 2   interpreter- [address] [employer], [phone]   Labs- 214 cholesterol, 125 LDL cholesterol, 118 triglycerides, 68 HDL cholesterol, 5.4 hemoglobin A1C, 94 mean plasma glucose. Patient understands and is aware of her lab results.   Goals-  1. Reduce the amount of fried and fatty foods consumed. Try to grill, bake, broil or sautee foods in a small amount of olive oil instead. 2. Reduce the amount of red meat. Increase lean protein intake like chicken or fish. 3. Daily exercise for 20-30 minutes.   Navigation:  Patient is aware of 1 more health coaching sessions and a follow up. Have referred patient to Internal Medicine for FU for elevated labs.

## 2023-06-30 ENCOUNTER — Encounter: Admitting: Student

## 2023-07-02 ENCOUNTER — Encounter: Payer: Self-pay | Admitting: Student

## 2023-07-02 ENCOUNTER — Ambulatory Visit (HOSPITAL_COMMUNITY)
Admission: RE | Admit: 2023-07-02 | Discharge: 2023-07-02 | Disposition: A | Discharge status: Home / Self Care | Source: Ambulatory Visit | Attending: Family Medicine | Admitting: Family Medicine

## 2023-07-02 ENCOUNTER — Ambulatory Visit (INDEPENDENT_AMBULATORY_CARE_PROVIDER_SITE_OTHER): Payer: Self-pay | Admitting: Student

## 2023-07-02 ENCOUNTER — Other Ambulatory Visit: Payer: Self-pay

## 2023-07-02 VITALS — BP 131/64 | HR 61 | Temp 97.6°F | Ht 59.0 in | Wt 146.6 lb

## 2023-07-02 DIAGNOSIS — E785 Hyperlipidemia, unspecified: Secondary | ICD-10-CM

## 2023-07-02 DIAGNOSIS — R42 Dizziness and giddiness: Secondary | ICD-10-CM | POA: Insufficient documentation

## 2023-07-02 DIAGNOSIS — I499 Cardiac arrhythmia, unspecified: Secondary | ICD-10-CM | POA: Insufficient documentation

## 2023-07-02 DIAGNOSIS — E7849 Other hyperlipidemia: Secondary | ICD-10-CM

## 2023-07-02 MED ORDER — VITAMIN B-12 100 MCG PO TABS: 100.0000 ug | ORAL_TABLET | Freq: Every day | ORAL | Status: AC

## 2023-07-02 MED ORDER — ROSUVASTATIN CALCIUM 10 MG PO TABS
40.0000 mg | ORAL_TABLET | Freq: Every day | ORAL | 11 refills | Status: DC
  Filled 2023-07-02: qty 30, 7d supply, fill #0
  Filled 2023-07-22: qty 30, 7d supply, fill #1
  Filled 2023-07-22: qty 120, 30d supply, fill #1
  Filled 2023-08-25: qty 120, 30d supply, fill #2
  Filled 2023-09-27 (×3): qty 90, 22d supply, fill #3

## 2023-07-02 MED ORDER — ROSUVASTATIN CALCIUM 10 MG PO TABS
40.0000 mg | ORAL_TABLET | Freq: Every day | ORAL | 11 refills | Status: DC
  Filled 2023-07-02: qty 30, 7d supply, fill #0

## 2023-07-02 NOTE — Patient Instructions (Addendum)
 Lucine Lucas Lopez,Thank you for allowing me to take part in your care today.  Here are your instructions.  1. I am checking many labs for you today. I will call you with the results.  2. Please increase your Crestor  to 40 mg daily   3. Please come back in about 4 weeks and we can see how you are doing.   Thank you, Dr. Lydia Sams  If you have any other questions please contact the internal medicine clinic at [phone] If it is after hours, please call the [place] hospital at [phone] and then ask the person who picks up for the resident on call.   Alfrieda Antes, gracias por permitirme participar en su atencin mdica hoy. Estas son sus instrucciones:  1. Hoy le estoy haciendo varios anlisis. La llamar con los [place].  2. Por favor, aumente su dosis de Crestor  a 40 mg al da.  3. Por favor, regrese en unas 4 semanas para ver cmo est.  Loise Rinks, Dr. Lydia Sams.  Si tiene alguna otra pregunta, comunquese con la clnica de medicina interna al [phone]. Si es fuera del horario de [place], llame al hospital [facility] al [phone] y luego pregunte a la persona que recoge al residente de guardia.

## 2023-07-02 NOTE — Assessment & Plan Note (Signed)
 Patient has a past medical history of hyperlipidemia.  Patient recently had lipid panel showing total cholesterol 214, LDL 125.  She has been taking Crestor  20 mg daily.  She denies any myalgias or adverse effects.  Discussed lifestyle modifications with patient and increasing medication.  Increase Crestor  to 40 mg daily.  Previously patient has had elevated liver enzymes and therefore would like to check LFTs today.  Plan: - Check CMP - Increase Crestor  40 mg daily - Continue lifestyle modifications - Follow-up in 6 months

## 2023-07-02 NOTE — Assessment & Plan Note (Signed)
 On exam today I auscultated what I felt to be an irregular heartbeat.  I did obtain EKG and at that time it showed normal sinus rhythm with no signs of PACs or PVCs.  If symptoms continue to occur, patient could benefit from a Zio patch.    Plan: - EKG normal - Follow-up other lab studies

## 2023-07-02 NOTE — Progress Notes (Signed)
 CC: Wise woman labs follow up and dizziness  HPI:  Ms.Anvitha Blasa Raisch is a 48 y.o. female with a past medical history of hyperlipidemia who presents for wisewoman lab follow-up as well as concerns for dizziness.  Please see assessment and plan for full HPI.  Medications: Hyperlipidemia: Crestor  40 mg daily  Past Medical History:  Diagnosis Date   Abdominal pain, chronic, right lower quadrant 10/05/2019   Blood transfusion without reported diagnosis    Fibroids    GERD (gastroesophageal reflux disease)    no meds   Hyperlipidemia    Postpartum care following vaginal delivery 09/30/2010   Trapezius muscle strain 07/10/2020     Current Outpatient Medications:    MAGNESIUM GLYCINATE PO, Take 240 mg by mouth daily., Disp: , Rfl:    rosuvastatin  (CRESTOR ) 10 MG tablet, Take 4 tablets (40 mg total) by mouth daily., Disp: 30 tablet, Rfl: 11   vitamin B-12 (CYANOCOBALAMIN) 100 MCG tablet, Take 1 tablet (100 mcg total) by mouth daily., Disp: , Rfl:   Review of Systems:   Negative except for what is stated in HPI  Physical Exam:  Vitals:   07/02/23 0856 07/02/23 1010 07/02/23 1011 07/02/23 1012  BP: (!) 116/55 128/64 139/64 131/64  Pulse: 61  62 61  Temp: 97.6 F (36.4 C)     TempSrc: Oral     SpO2: 100%     Weight: 146 lb 9.6 oz (66.5 kg)     Height: 4\' 11"  (1.499 m)      General: Patient is sitting comfortably in the room  Head: Normocephalic, atraumatic  Neck: Supple, nontender, no growths noted on exam Cardio: Irregular rhythm, normal rate, no murmurs, rubs, or gallops Pulmonary: Clear to ausculation bilaterally with no rales, rhonchi, and crackles    Assessment & Plan:   Hyperlipidemia Patient has a past medical history of hyperlipidemia.  Patient recently had lipid panel showing total cholesterol 214, LDL 125.  She has been taking Crestor  20 mg daily.  She denies any myalgias or adverse effects.  Discussed lifestyle modifications with patient and increasing  medication.  Increase Crestor  to 40 mg daily.  Previously patient has had elevated liver enzymes and therefore would like to check LFTs today.  Plan: - Check CMP - Increase Crestor  40 mg daily - Continue lifestyle modifications - Follow-up in 6 months  Dizziness Patient endorses 49-month history of dizziness/lightheadedness.  Due to language barrier unable to decipher which one.  She also reports irregular menstrual cycles.  She had skipped her menstrual cycle on January and February and had one in March.  She also had 2 in April.  She has not had one yet in may.  She denies any heavy bleeding.  She denies any passing out episodes.  She denies any feeling of being cold.  She does endorse having hot flashes.  She denies any growth on her neck.  She denies any falls.  She has not tried anything for this.  I am wondering if she is perimenopausal at this time and developing the symptoms of the change.  Would like to rule out any arrhythmias.  Will also like to rule out thyroid etiology.  Will check labs and obtain EKG.  Plan: - EKG showed normal sinus rhythm - TSH pending - Pregnancy test pending - CMP pending - CBC pending -Return in 4 weeks  Irregular heart beat On exam today I auscultated what I felt to be an irregular heartbeat.  I did obtain EKG and at  that time it showed normal sinus rhythm with no signs of PACs or PVCs.  If symptoms continue to occur, patient could benefit from a Zio patch.    Plan: - EKG normal - Follow-up other lab studies  Patient discussed with Dr. Julie Machen  Geoffrey Hynes, DO PGY-2 Internal Medicine Resident

## 2023-07-02 NOTE — Assessment & Plan Note (Addendum)
 Patient endorses 60-month history of dizziness/lightheadedness.  Due to language barrier unable to decipher which one.  She also reports irregular menstrual cycles.  She had skipped her menstrual cycle on January and February and had one in March.  She also had 2 in April.  She has not had one yet in may.  She denies any heavy bleeding.  She denies any passing out episodes.  She denies any feeling of being cold.  She does endorse having hot flashes.  She denies any growth on her neck.  She denies any falls.  She has not tried anything for this.  I am wondering if she is perimenopausal at this time and developing the symptoms of the change.  Would like to rule out any arrhythmias.  Will also like to rule out thyroid etiology.  Will check labs and obtain EKG.  Plan: - EKG showed normal sinus rhythm - TSH pending - Pregnancy test pending - CMP pending - CBC pending -Return in 4 weeks

## 2023-07-03 LAB — CBC
Hematocrit: 44.9 % (ref 34.0–46.6)
Hemoglobin: 14.3 g/dL (ref 11.1–15.9)
MCH: 28.1 pg (ref 26.6–33.0)
MCHC: 31.8 g/dL (ref 31.5–35.7)
MCV: 88 fL (ref 79–97)
Platelets: 273 10*3/uL (ref 150–450)
RBC: 5.09 x10E6/uL (ref 3.77–5.28)
RDW: 13.1 % (ref 11.7–15.4)
WBC: 12.6 10*3/uL — ABNORMAL HIGH (ref 3.4–10.8)

## 2023-07-03 LAB — MAGNESIUM: Magnesium: 2.1 mg/dL (ref 1.6–2.3)

## 2023-07-03 LAB — CMP14 + ANION GAP
ALT: 13 IU/L (ref 0–32)
AST: 17 IU/L (ref 0–40)
Albumin: 4.9 g/dL (ref 3.9–4.9)
Alkaline Phosphatase: 66 IU/L (ref 44–121)
Anion Gap: 18 mmol/L (ref 10.0–18.0)
BUN/Creatinine Ratio: 19 (ref 9–23)
BUN: 13 mg/dL (ref 6–24)
Bilirubin Total: 0.4 mg/dL (ref 0.0–1.2)
CO2: 18 mmol/L — ABNORMAL LOW (ref 20–29)
Calcium: 10 mg/dL (ref 8.7–10.2)
Chloride: 104 mmol/L (ref 96–106)
Creatinine, Ser: 0.68 mg/dL (ref 0.57–1.00)
Globulin, Total: 2.5 g/dL (ref 1.5–4.5)
Glucose: 89 mg/dL (ref 70–99)
Potassium: 4.5 mmol/L (ref 3.5–5.2)
Sodium: 140 mmol/L (ref 134–144)
Total Protein: 7.4 g/dL (ref 6.0–8.5)
eGFR: 107 mL/min/{1.73_m2} (ref >59)

## 2023-07-03 LAB — TSH RFX ON ABNORMAL TO FREE T4: TSH: 1.08 u[IU]/mL (ref 0.450–4.500)

## 2023-07-03 LAB — HCG, SERUM, QUALITATIVE: hCG,Beta Subunit,Qual,Serum: NEGATIVE m[IU]/mL (ref <6)

## 2023-07-06 ENCOUNTER — Ambulatory Visit: Payer: Self-pay | Admitting: Student

## 2023-07-07 NOTE — Progress Notes (Signed)
 Internal Medicine Clinic Attending  Case discussed with the resident at the time of the visit.  We reviewed the resident's history and exam and pertinent patient test results.  I agree with the assessment, diagnosis, and plan of care documented in the resident's note.

## 2023-07-22 ENCOUNTER — Other Ambulatory Visit: Payer: Self-pay

## 2023-08-04 ENCOUNTER — Ambulatory Visit: Payer: Self-pay | Admitting: Student

## 2023-08-04 VITALS — BP 115/68 | HR 61 | Temp 98.2°F | Wt 143.8 lb

## 2023-08-04 DIAGNOSIS — I499 Cardiac arrhythmia, unspecified: Secondary | ICD-10-CM

## 2023-08-04 DIAGNOSIS — R42 Dizziness and giddiness: Secondary | ICD-10-CM

## 2023-08-04 DIAGNOSIS — D72829 Elevated white blood cell count, unspecified: Secondary | ICD-10-CM

## 2023-08-04 NOTE — Progress Notes (Unsigned)
 CC: 1 month f/u  HPI:  [name] is a 48 y.o. female living with a history stated below and presents today for 1 month f/u. Please see problem based assessment and plan for additional details.  In-person Spanish interpreter present during this encounter.   Past Medical History:  Diagnosis Date   Abdominal pain, chronic, right lower quadrant 10/05/2019   Blood transfusion without reported diagnosis    Fibroids    GERD (gastroesophageal reflux disease)    no meds   Hyperlipidemia    Postpartum care following vaginal delivery 09/30/2010   Trapezius muscle strain 07/10/2020    Current Outpatient Medications on File Prior to Visit  Medication Sig Dispense Refill   MAGNESIUM GLYCINATE PO Take 240 mg by mouth daily.     rosuvastatin  (CRESTOR ) 10 MG tablet Take 4 tablets (40 mg total) by mouth daily. 30 tablet 11   vitamin B-12 (CYANOCOBALAMIN) 100 MCG tablet Take 1 tablet (100 mcg total) by mouth daily.     No current facility-administered medications on file prior to visit.    Family History  Problem Relation Age of Onset   Hypertension Mother    Heart disease Mother    Stomach cancer Maternal Uncle    Uterine cancer Maternal Grandmother    Thyroid disease Son    Colon cancer Neg Hx    Pancreatic cancer Neg Hx    Esophageal cancer Neg Hx    Breast cancer Neg Hx     Social History   Socioeconomic History   Marital status: Married    Spouse name: Not on file   Number of children: 4   Years of education: Not on file   Highest education level: 6th grade  Occupational History   Occupation: Cleaning services  Tobacco Use   Smoking status: Never   Smokeless tobacco: Never  Vaping Use   Vaping status: Never Used  Substance and Sexual Activity   Alcohol use: No   Drug use: No   Sexual activity: Yes    Birth control/protection: Condom  Other Topics Concern   Not on file  Social History Narrative   Pt was born in [place]   Social Drivers of Health    Financial Resource Strain: Not on file  Food Insecurity: No Food Insecurity (06/09/2023)   Hunger Vital Sign    Worried About Running Out of Food in the Last Year: Never true    Ran Out of Food in the Last Year: Never true  Transportation Needs: No Transportation Needs (06/09/2023)   PRAPARE - Administrator, Civil Service (Medical): No    Lack of Transportation (Non-Medical): No  Physical Activity: Not on file  Stress: Not on file  Social Connections: Not on file  Intimate Partner Violence: Not on file    Review of Systems: ROS negative except for what is noted on the assessment and plan.  Vitals:   08/04/23 1006  BP: 115/68  Pulse: 61  Temp: 98.2 F (36.8 C)  TempSrc: Oral  SpO2: 98%  Weight: 143 lb 12.8 oz (65.2 kg)   Physical Exam: Constitutional: well-appearing female sitting in chair comfortably, in no acute distress Eyes: no nystagmus, EOMI intact Cardiovascular: regular rate and rhythm, no murmur Pulmonary/Chest: normal work of breathing on room air, lungs clear to auscultation bilaterally Neurological: alert & oriented x 3, 5/5 strength in all extremities, sensation intact  Assessment & Plan:   Irregular heart beat Exam today with RRR. EKG at last OV with  NSR, normal axis and interval. Will defer Zio patch for now unless symptoms re-develop or new cardiac symptoms arises.   Episodic lightheadedness 1 month f/u. Clarified with patient that she describes lightheadedness like going to pass out instead of dizziness (room spinning). Ongoing since January. Associated symptoms include episodic heat intolerance, night sweats, heart racing and fatigue. Did mention around this time her menstrual cycles have become irregular. LMP was 6/16 lasted 4 days, uses 2-3 pads daily. The lightheadedness has decreased in frequency and episodic lasting only few seconds with certain positional changes.   Workup at last OV was largely unremarkable except for mild leukocytosis.  Normal CMP and TSH. EKG was NSR. Negative for anemia or pregnancy.   Orthostatic vitals negative today. Exam without nystagmus or focal neuro deficits.   Given symptom onset aligning with irregular menses, could suspect perimenopause at 48 yo. Discussed medications for symptoms mgmt but declined today. States does not interfere with daily activities at this time. Will continue to monitor.   Leukocytosis CBC at last OV with mild leukocytosis. Unclear of etiology. Denies recent illness or sick contacts. Denies fever or chills, dysuria, cough. No recent weight loss and weight been stable. Will repeat CBC today.    Patient discussed with Dr. Francesco Ozell Nearing, D.O. [facility] Internal Medicine, PGY-2 Phone: [phone] Date 08/04/2023 Time 1:36 PM

## 2023-08-04 NOTE — Assessment & Plan Note (Signed)
 Exam today with RRR. EKG at last OV with NSR, normal axis and interval. Will defer Zio patch for now unless symptoms re-develop or new cardiac symptoms arises.

## 2023-08-04 NOTE — Assessment & Plan Note (Signed)
 1 month f/u. Clarified with patient that she describes lightheadedness like going to pass out instead of dizziness (room spinning). Ongoing since January. Associated symptoms include episodic heat intolerance, night sweats, heart racing and fatigue. Did mention around this time her menstrual cycles have become irregular. LMP was 6/16 lasted 4 days, uses 2-3 pads daily. The lightheadedness has decreased in frequency and episodic lasting only few seconds with certain positional changes.   Workup at last OV was largely unremarkable except for mild leukocytosis. Normal CMP and TSH. EKG was NSR. Negative for anemia or pregnancy.   Orthostatic vitals negative today. Exam without nystagmus or focal neuro deficits.   Given symptom onset aligning with irregular menses, could suspect perimenopause at 48 yo. Discussed medications for symptoms mgmt but declined today. States does not interfere with daily activities at this time. Will continue to monitor.

## 2023-08-04 NOTE — Patient Instructions (Addendum)
 Gracias, Sra. Chelsea Yates, por permitirnos atenderla hoy. Hoy hablamos sobre:  - Sus sntomas de Research scientist (life sciences), sofocos y sudores nocturnos, junto con Engineer, technical sales, pueden ser signos de la menopausia. - Mantngase hidratada en casa. - Si sus sntomas empeoran o interfieren con sus actividades diarias, por favor llame al consultorio. - Si nota ms palpitaciones, llame al consultorio para que le indiquen un monitor cardaco. - Anlisis de sangre hoy; le dar los [place]. -------------------------------- Thank you, [name] for allowing us  to provide your care today. Today we discussed:  -Your symptoms of lightheaded, hot flashes and night sweats along with irregular menstrual cycles can be signs of menopause. -Stay hydrated at home.  -If your symptoms worsen or interferes with your daily activities, please call the office. -If you notice more heart palpitations, call the office for possible heart monitor.  -Blood work today, I will call with results.   Follow up: 1-3 months   Should you have any questions or concerns please call the internal medicine clinic at [phone].    Sherrel Shafer, D.O. [facility]

## 2023-08-04 NOTE — Assessment & Plan Note (Signed)
 CBC at last OV with mild leukocytosis. Unclear of etiology. Denies recent illness or sick contacts. Denies fever or chills, dysuria, cough. No recent weight loss and weight been stable. Will repeat CBC today.

## 2023-08-05 ENCOUNTER — Ambulatory Visit: Payer: Self-pay | Admitting: Student

## 2023-08-05 ENCOUNTER — Encounter: Payer: Self-pay | Admitting: Student

## 2023-08-05 LAB — CBC WITH DIFFERENTIAL/PLATELET
Basophils Absolute: 0 10*3/uL (ref 0.0–0.2)
Basos: 1 %
EOS (ABSOLUTE): 0.1 10*3/uL (ref 0.0–0.4)
Eos: 1 %
Hematocrit: 42.4 % (ref 34.0–46.6)
Hemoglobin: 13.5 g/dL (ref 11.1–15.9)
Immature Grans (Abs): 0 10*3/uL (ref 0.0–0.1)
Immature Granulocytes: 0 %
Lymphocytes Absolute: 1.4 10*3/uL (ref 0.7–3.1)
Lymphs: 21 %
MCH: 28.3 pg (ref 26.6–33.0)
MCHC: 31.8 g/dL (ref 31.5–35.7)
MCV: 89 fL (ref 79–97)
Monocytes Absolute: 0.5 10*3/uL (ref 0.1–0.9)
Monocytes: 7 %
Neutrophils Absolute: 4.6 10*3/uL (ref 1.4–7.0)
Neutrophils: 70 %
Platelets: 277 10*3/uL (ref 150–450)
RBC: 4.77 x10E6/uL (ref 3.77–5.28)
RDW: 12.4 % (ref 11.7–15.4)
WBC: 6.5 10*3/uL (ref 3.4–10.8)

## 2023-08-06 NOTE — Progress Notes (Signed)
 Internal Medicine Clinic Attending  Case discussed with the resident at the time of the visit.  We reviewed the resident's history and exam and pertinent patient test results.  I agree with the assessment, diagnosis, and plan of care documented in the resident's note.

## 2023-08-25 ENCOUNTER — Other Ambulatory Visit: Payer: Self-pay

## 2023-09-27 ENCOUNTER — Other Ambulatory Visit: Payer: Self-pay

## 2023-09-27 ENCOUNTER — Other Ambulatory Visit: Payer: Self-pay | Admitting: Student

## 2023-09-27 MED ORDER — ROSUVASTATIN CALCIUM 40 MG PO TABS
40.0000 mg | ORAL_TABLET | Freq: Every day | ORAL | 11 refills | Status: AC
  Filled 2023-09-27: qty 30, 30d supply, fill #0
  Filled 2023-10-25: qty 30, 30d supply, fill #1
  Filled 2023-12-06: qty 30, 30d supply, fill #2
  Filled 2024-01-05: qty 30, 30d supply, fill #3
  Filled 2024-03-09: qty 90, 90d supply, fill #4

## 2023-09-28 ENCOUNTER — Other Ambulatory Visit: Payer: Self-pay

## 2023-10-25 ENCOUNTER — Other Ambulatory Visit: Payer: Self-pay

## 2023-12-06 ENCOUNTER — Other Ambulatory Visit: Payer: Self-pay

## 2024-01-05 ENCOUNTER — Other Ambulatory Visit: Payer: Self-pay

## 2024-01-21 ENCOUNTER — Telehealth: Payer: Self-pay

## 2024-01-21 NOTE — Telephone Encounter (Signed)
 Health Coaching 3  interpreter- Bernice Angry, [employer]   Goals- Working on practicing a heart healthy diet. Reducing the amount of fried and fatty foods consumed. Daily exercise for 20-30 minutes when possible.     Navigation:  Patient is aware of a follow up session. Patient is scheduled for FU visit on 03/15/24 @ 9:30 am.

## 2024-03-09 ENCOUNTER — Other Ambulatory Visit: Payer: Self-pay

## 2024-03-15 ENCOUNTER — Other Ambulatory Visit: Payer: Self-pay

## 2024-03-15 ENCOUNTER — Inpatient Hospital Stay: Payer: Self-pay | Admitting: *Deleted

## 2024-03-15 VITALS — BP 115/47 | Ht 59.0 in | Wt 147.1 lb

## 2024-03-15 DIAGNOSIS — Z1231 Encounter for screening mammogram for malignant neoplasm of breast: Secondary | ICD-10-CM

## 2024-03-15 DIAGNOSIS — Z0000 Encounter for general adult medical examination without abnormal findings: Secondary | ICD-10-CM

## 2024-03-15 NOTE — Progress Notes (Signed)
 [facility] follow up   Interpreter: Bernice Angry, [facility]ER[facility]  Clinical Measurement:   Vitals:   03/15/24 0937 03/15/24 1000  BP: (!) 125/56 (!) 115/47      Medical History: Patient states that she has high cholesterol, does not have high blood pressure and she does not have diabetes. Patient states that she does not have history of gestational hypertension, does not have history of pre-eclampsia/eclampsia and she does not have history of gestational diabetes.     Medications: Patient states that she does take medication to lower cholesterol, blood pressure and blood sugar.  Patient does not take an aspirin a day to help prevent a heart attack or stroke. During the past 7 days patient has taken prescribed medication to lower cholesterol on 7 days.   Blood pressure, self measurement: Patient states that she does not measure blood pressure from home. She checks her blood pressure N/A. She shares her readings with a health care provider: N/A.   Nutrition: Patient states that on average she eats 2-3 cups of fruit and 2-3 cups of vegetables per day. Patient states that she does eat fish at least 2 times per week. Patient eats more than half servings of whole grains. Patient drinks less than 36 ounces of beverages with added sugar weekly: yes. Patient is currently watching sodium or salt intake: yes. In the past 7 days patient has had 0 drinks containing alcohol. On average patient drinks 0 drinks containing alcohol per day.      Physical activity: Patient states that she gets 0 minutes of physical activity each week.  Smoking status: Patient states that she has has never smoked .   Quality of life: Over the past 2 weeks patient states that she had little interest or pleasure in doing things: not at all. She has been feeling down, depressed or hopeless:not at all.   Social Determinants of Health Assessment:   Computer Use: During the last 12 months patient states that she has used any of the  following: desktop/laptop, smart phone or tablet/other portable wireless computer: yes.   Internet Use: During the last 12 months, did you or any member of your household have access to the internet: Yes, by paying a cell phone company or internet service provider.   Food Insecurities: During the last 12 months, where there any times when you were worried that you would run out of food because of a lack of money or other resources: No.   Transportation Barriers: During the last 12 months, have you missed a doctor's appointment because of transportation problems: No.   Childcare Barriers: If you are currently using childcare services, please identify  the type of services you use. (If not using childcare services, please select Not applicable): not applicable. During the last 12 months, have you had any barriers to childcare services such as: not applicable.   Housing: What is your housing situation today: I have housing.   Intimate Partner Violence: During the last 12 months, how often did your partner physically hurt you: never. During the last 12 months, how often did your partner insult you or talk down to you: never.  Medication Adherence: During the last 12 months, did you ever forget to take your medicine: No. During the last 12 months, were you careless ar times about taking your medicine: No. During the last 12 months, when you felt better did you sometimes stop taking your medication: No. During the last 12 months, sometimes if you felt worse when you took  your medicine did you stop taking it: No.    Risk reduction and counseling: Patient's BP was low today x2. Asked patient if she has felt light-headed or dizzy recently. Patient stated that she has not noticed anything. Patient was seen for these symptoms in June 2025. Patient stated that she was told by PCP to increase water intake. Patient stated that she has increased water intake. Instructed patient to call and let me know if  anything changes with these symptoms over the next few months. Will check patient's BP again at [facility] visit in April. Will re-screen patient again in May.    Navigation: This was the  follow up session for this patient, I will check up on her progress in the coming months.

## 2024-04-26 ENCOUNTER — Ambulatory Visit: Payer: Self-pay | Admitting: Student

## 2024-05-11 ENCOUNTER — Ambulatory Visit

## 2024-05-11 ENCOUNTER — Encounter

## 2024-06-14 ENCOUNTER — Inpatient Hospital Stay

## 2024-06-14 ENCOUNTER — Other Ambulatory Visit: Payer: Self-pay
